# Patient Record
Sex: Male | Born: 1985 | Race: White | Hispanic: No | Marital: Married | Smoking: Never smoker
Health system: Southern US, Community
[De-identification: ages and names within clinical notes are randomized; demographics above are authoritative.]

## PROBLEM LIST (undated history)

## (undated) DIAGNOSIS — F419 Anxiety disorder, unspecified: Secondary | ICD-10-CM

## (undated) DIAGNOSIS — F32A Depression, unspecified: Secondary | ICD-10-CM

## (undated) HISTORY — DX: Anxiety disorder, unspecified: F41.9

## (undated) HISTORY — DX: Depression, unspecified: F32.A

---

## 2009-01-11 ENCOUNTER — Ambulatory Visit: Payer: Self-pay | Admitting: Family Medicine

## 2009-02-11 HISTORY — PX: APPENDECTOMY: SHX54

## 2009-12-30 ENCOUNTER — Inpatient Hospital Stay: Payer: Self-pay | Admitting: Surgery

## 2010-01-02 LAB — PATHOLOGY REPORT

## 2010-01-15 ENCOUNTER — Inpatient Hospital Stay: Payer: Self-pay | Admitting: Surgery

## 2012-10-28 IMAGING — CT CT ABD-PELV W/ CM
1 of 2 series · 15 of 32 positions shown, 19 images · non-contrast
Comparison: none

REASON FOR EXAM: (1) abd pain post ruptured appendectomy fever; (2) abd
pain post ruptured append
COMMENTS:   May transport without cardiac monitor

[Series 2: 3mm soft tissue · axial · 0.75mm/px · z∈[-1089,-561]mm · 15 of 193 slices shown, 19 images]
[im 9/193  soft-tissue]
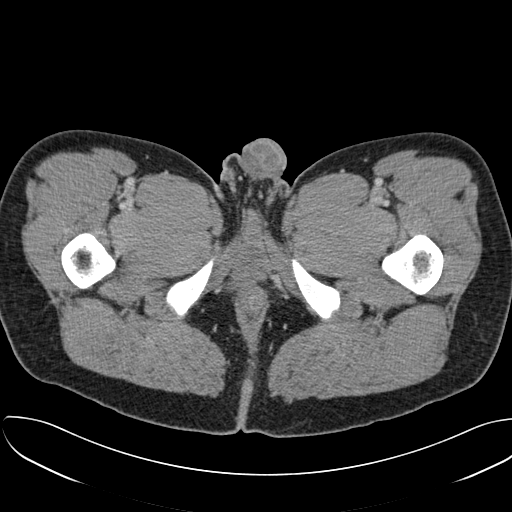
[im 9/193  bone]
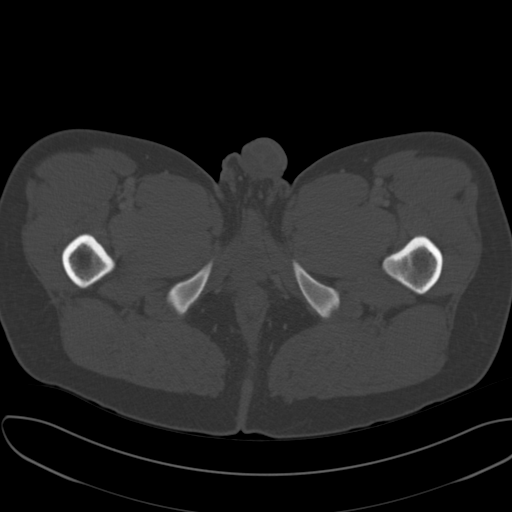
[im 25/193  soft-tissue]
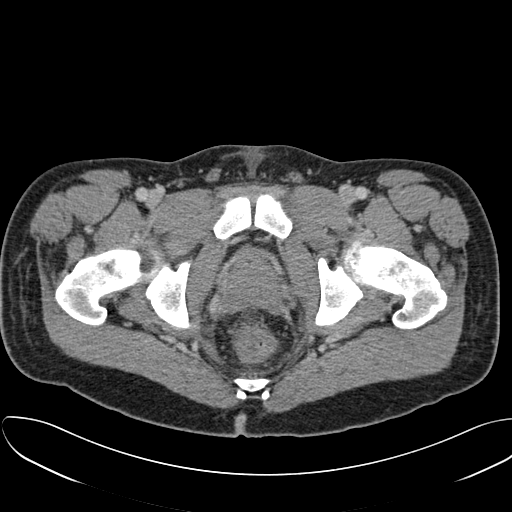
[im 41/193  soft-tissue]
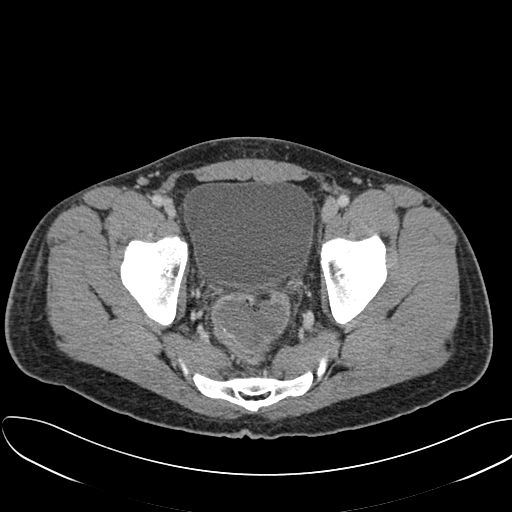
[im 57/193  soft-tissue]
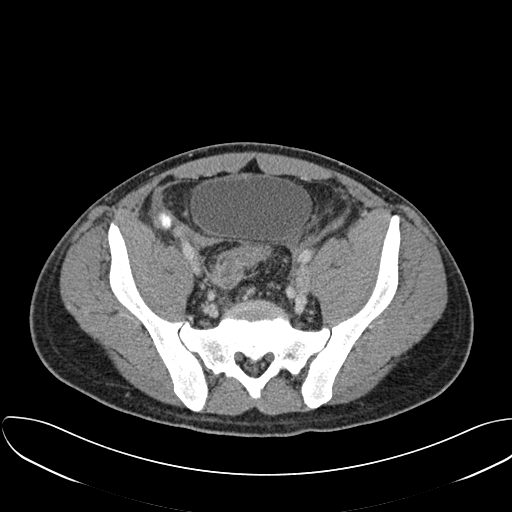
[im 65/193  soft-tissue]
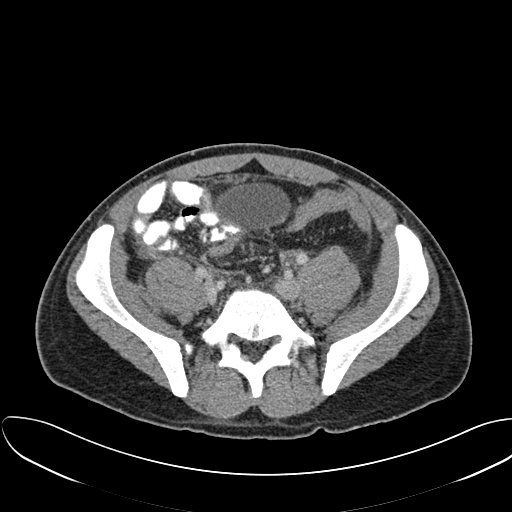
[im 81/193  soft-tissue]
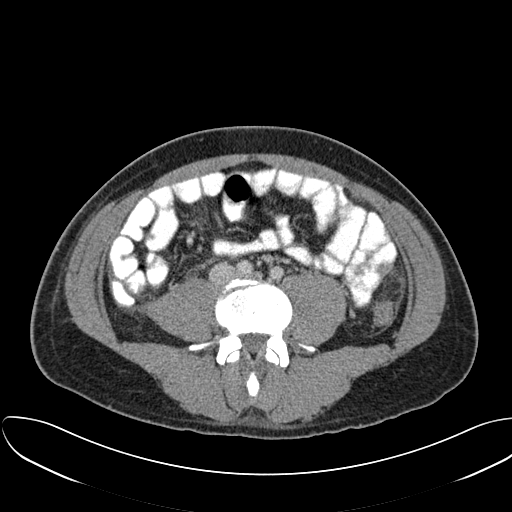
[im 97/193  soft-tissue]
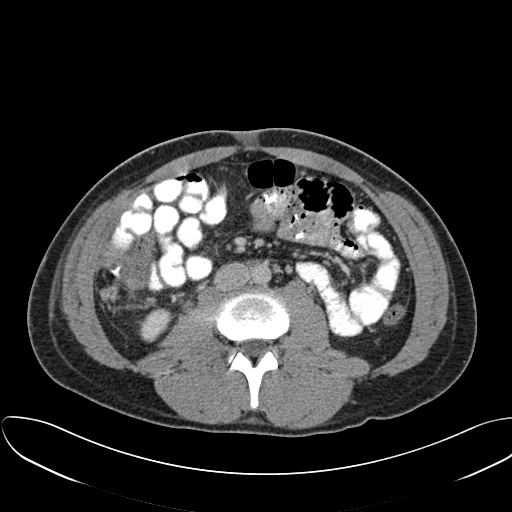
[im 113/193  soft-tissue]
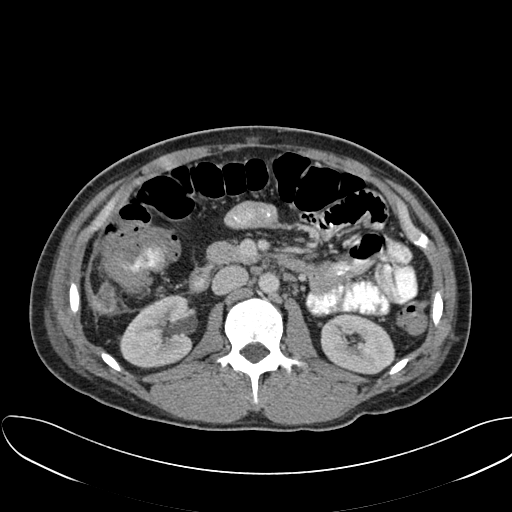
[im 129/193  soft-tissue]
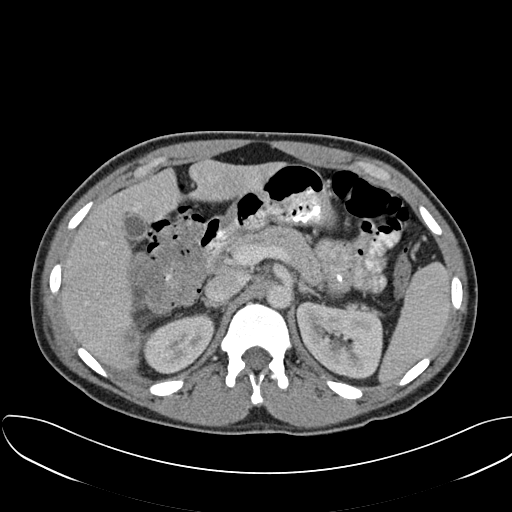
[im 129/193  bone]
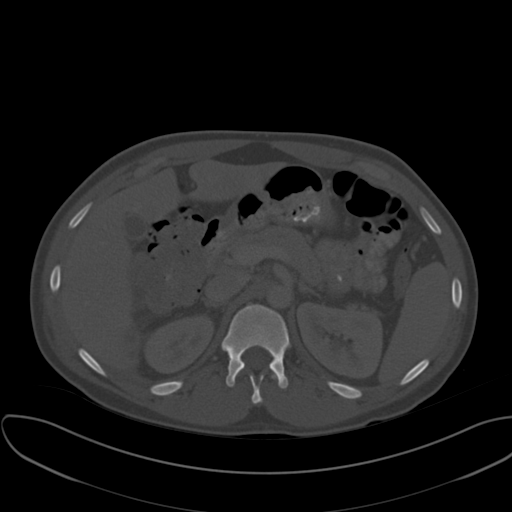
[im 137/193  soft-tissue]
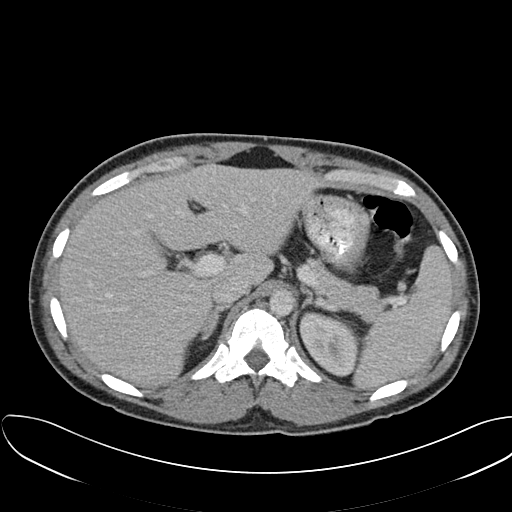
[im 153/193  soft-tissue]
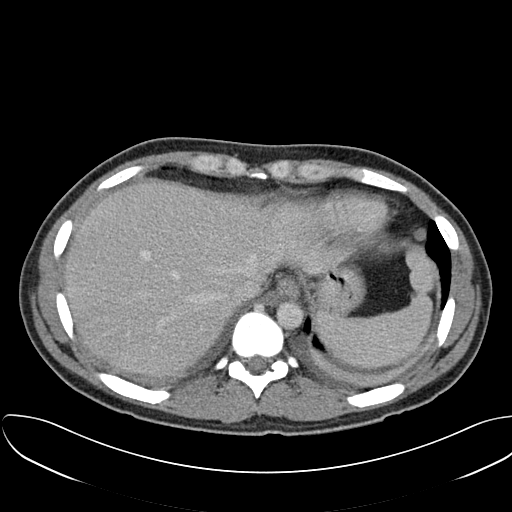
[im 161/193  lung]
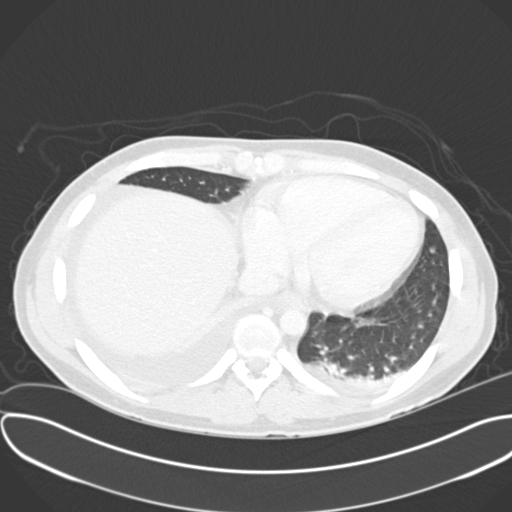
[im 169/193  soft-tissue]
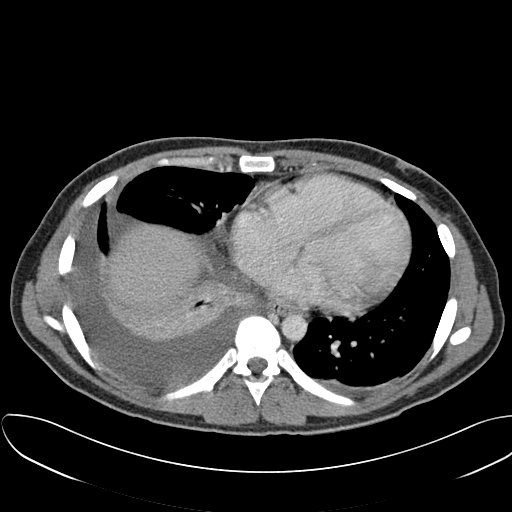
[im 169/193  lung]
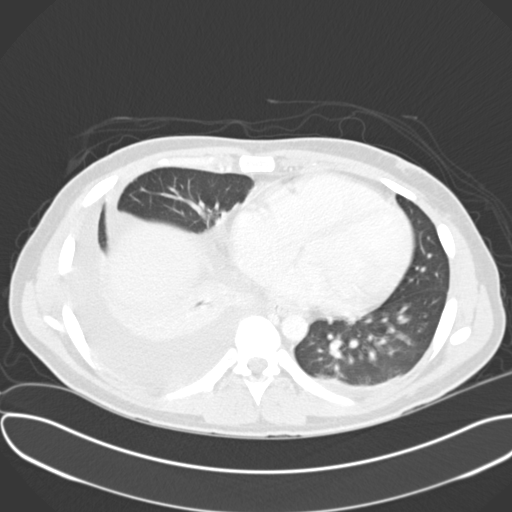
[im 177/193  lung]
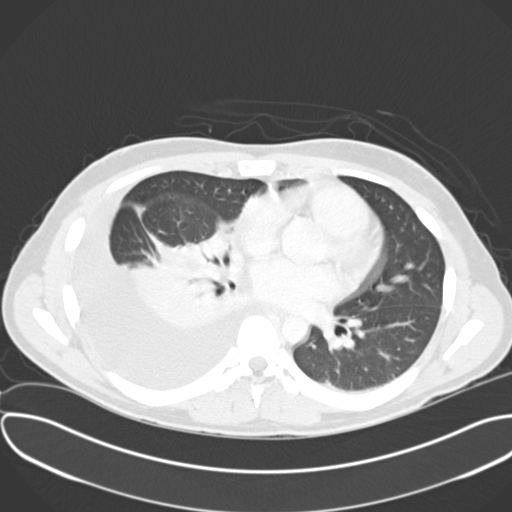
[im 185/193  soft-tissue]
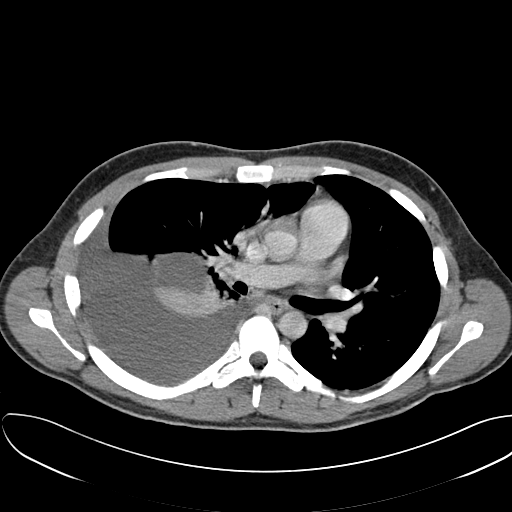
[im 185/193  lung]
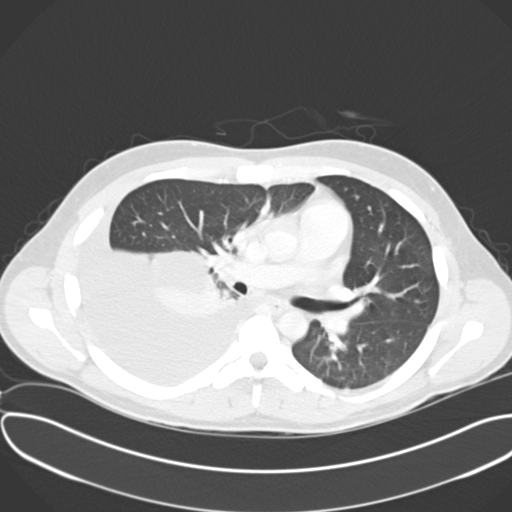

[15 of 32 positions shown; findings below may reference images not displayed]

PROCEDURE:     CT  - CT ABDOMEN / PELVIS  W  - January 15, 2010  [DATE]

RESULT:     CT of the abdomen and pelvis is performed utilizing 100 mL of
Dsovue-QLB iodinated intravenous contrast and oral contrast. There is no
previous study for comparison.

There are fluid collections in the subhepatic space posteriorly and in the
prerectal space and posterior inferior pelvic region. There appears to be
some mild thickening of the wall of cecum and ascending colon which could
represent inflammatory change either primary or secondary. A small hiatal
hernia is present. A moderately large right pleural effusion is present with
a trace to small left pleural effusion. No free air is identified. The
gallbladder is relatively decompressed. The liver, spleen, kidneys, aorta,
pancreas and remaining bowel structures appear to be within normal limits.
Parts of bowel are unopacified. Surgical clips are seen in the retrocecal
region consistent with appendectomy. There is some inflammatory stranding in
this region. The urinary bladder is moderately distended. The prostate
appears to be within normal limits.
IMPRESSION: 1. Multiple abdominal and pelvic abscess these.
2. Moderately large to large right pleural effusion with compressive
atelectasis versus infiltrate predominately in the right lower lobe and
right middle lobe.
3. Appendectomy changes.
4. Mild thickening of the wall of the ascending colon and cecum which could
be secondary. Colitis is felt to be less likely.

## 2012-10-28 IMAGING — US US GUIDE NEEDLE - US [PERSON_NAME]
1 series · 4 of 4 positions shown · non-contrast
Comparison: none

REASON FOR EXAM: large pleural effusion, please send fluid for [place] and
routine lab analysis.
COMMENTS:

[Series 1: us guide needle - us (person_name) · 4 of 4 slices shown]
[im 1/4]
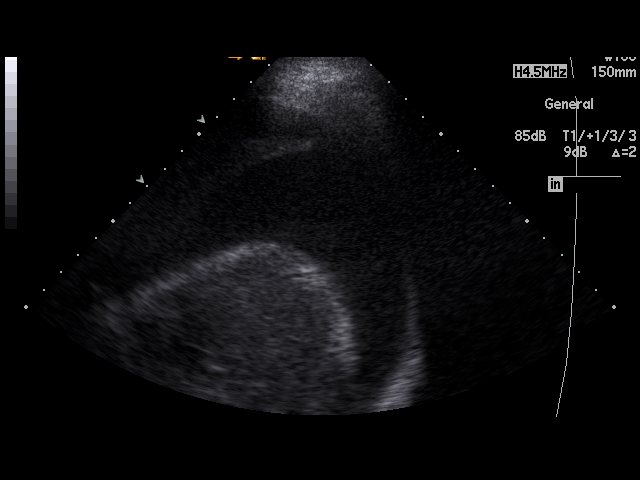
[im 2/4]
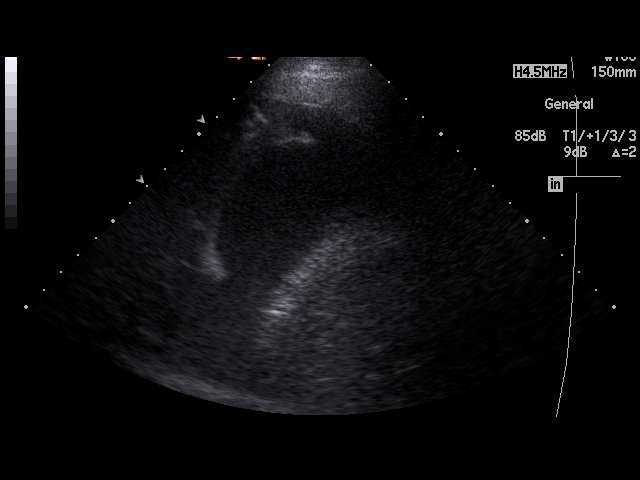
[im 3/4]
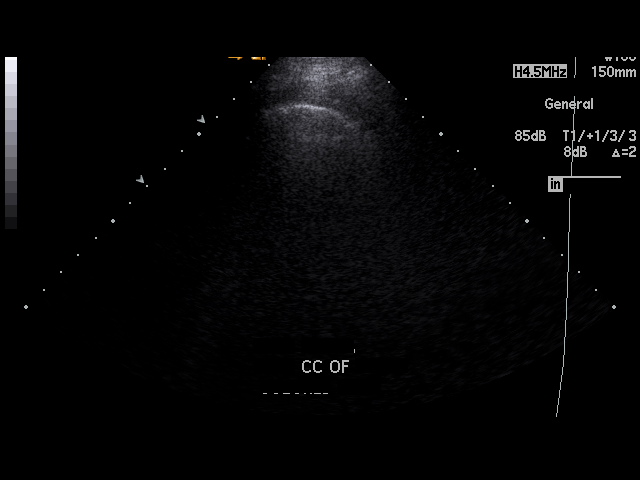
[im 4/4]
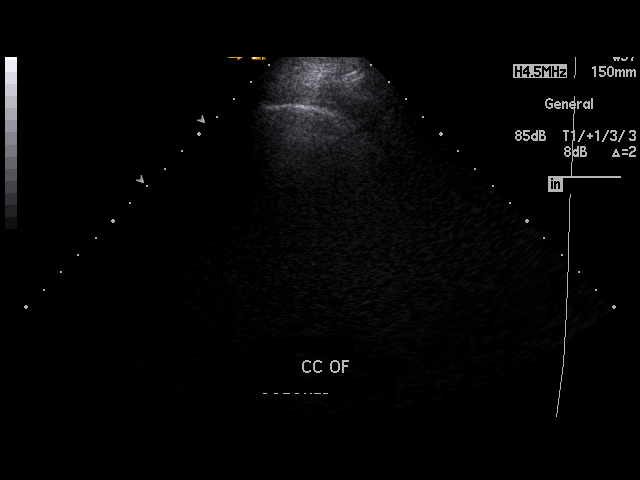

[4 of 4 positions shown; findings below may reference images not displayed]

PROCEDURE:     US  - US GUIDED THORACENTESIS RIGHT  - January 15, 2010  [DATE]

RESULT:     Comparison:  None

Procedure:
Clinical assessment was performed and informed consent obtained after all
questions were answered. The patient was was positioned upright and slightly
hunched forward.  Focused right thoracic ultrasound demonstrated a moderate
amount of pleural fluid. The mid thoracic wall was marked for thoracentesis.

A timeout was performed.  The right thorax was prepped and draped in the
usual sterile manner.  The overlying skin was anesthetized with 2 ml of 1%
lidocaine. The pleural space was accessed using a 6 French Safe-T centesis
needle catheter system and connected to a Vacutainer.

9666 ml of straw-colored pleural fluid was removed.  The drainage catheter
was removed and a sterile vaseline gauze bandage was placed at the puncture
site. The procedure was well tolerated and without complication. Hemostasis
was achieved.
IMPRESSION: Successful right ultrasound guided thoracentesis.

## 2012-10-29 IMAGING — CR DG CHEST 1V PORT
1 series · 1 of 1 positions shown · non-contrast
Comparison: none

REASON FOR EXAM: effusion
COMMENTS:

PROCEDURE:     DXR - DXR PORTABLE CHEST SINGLE VIEW  - January 16, 2010  [DATE]
RESULT:     The previously observed right pleural effusion is less prominent
compared to the pre-thoracentesis examination of 04/15/2009. No pneumothorax
is seen. The left lung field is clear.

[view not recorded]
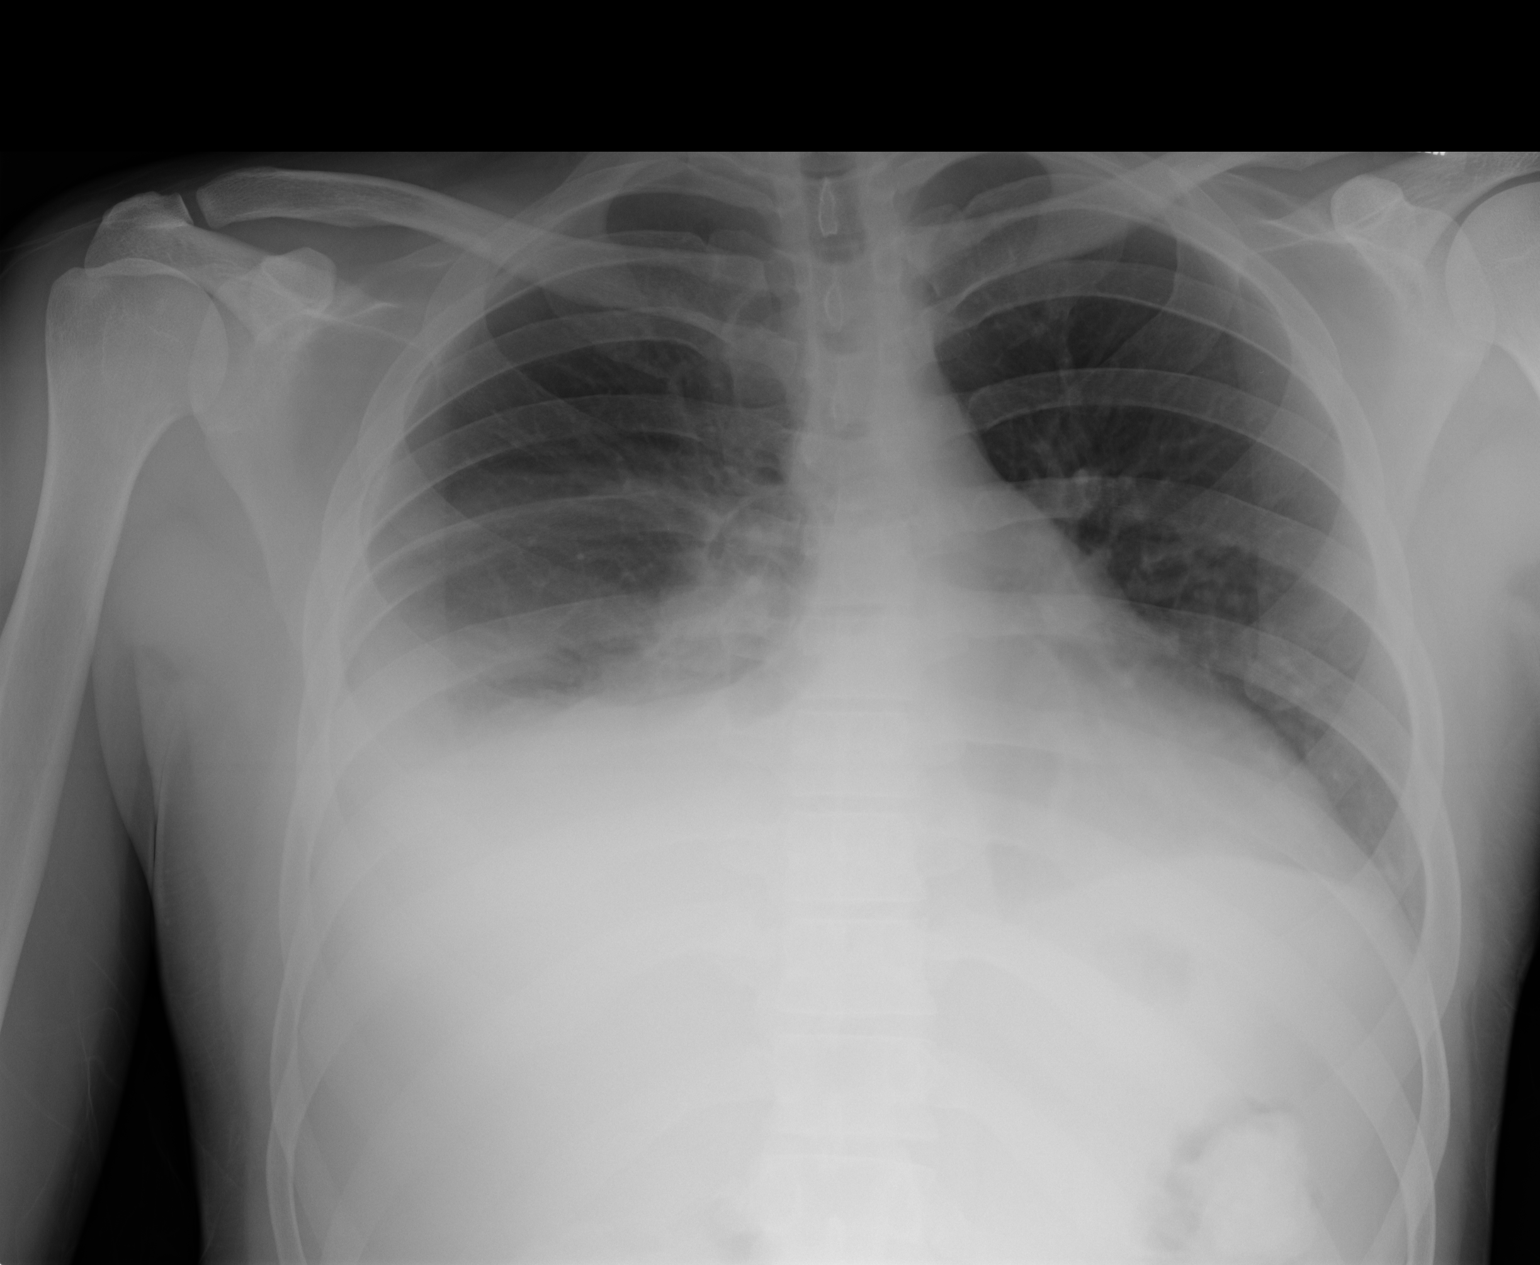

[1 of 1 positions shown; findings below may reference images not displayed]

IMPRESSION: 1.     Please see above.

## 2017-02-11 HISTORY — PX: VASECTOMY: SHX75

## 2017-06-02 ENCOUNTER — Other Ambulatory Visit: Payer: Self-pay

## 2017-06-02 ENCOUNTER — Ambulatory Visit
Admission: EM | Admit: 2017-06-02 | Discharge: 2017-06-02 | Disposition: A | Discharge status: Home / Self Care | Payer: Managed Care, Other (non HMO) | Attending: Family Medicine | Admitting: Family Medicine

## 2017-06-02 DIAGNOSIS — H6501 Acute serous otitis media, right ear: Secondary | ICD-10-CM | POA: Diagnosis not present

## 2017-06-02 DIAGNOSIS — J209 Acute bronchitis, unspecified: Secondary | ICD-10-CM

## 2017-06-02 DIAGNOSIS — J0100 Acute maxillary sinusitis, unspecified: Secondary | ICD-10-CM

## 2017-06-02 MED ORDER — ALBUTEROL SULFATE HFA 108 (90 BASE) MCG/ACT IN AERS: 1.0000 | INHALATION_SPRAY | Freq: Four times a day (QID) | RESPIRATORY_TRACT | 0 refills | Status: DC | PRN

## 2017-06-02 MED ORDER — AZITHROMYCIN 250 MG PO TABS: ORAL_TABLET | ORAL | 0 refills | Status: DC

## 2017-06-02 NOTE — ED Provider Notes (Signed)
[facility]    CSN: [id number] Arrival date & time: 06/02/17  1443     History   Chief Complaint Chief Complaint  Patient presents with  . Sinusitis    HPI Dragon Thrush is a 32 y.o. male.   The history is provided by the patient.  Sinusitis  Associated symptoms: congestion, cough, fatigue, fever, headaches and wheezing   URI  Presenting symptoms: congestion, cough, facial pain, fatigue and fever   Severity:  Moderate Onset quality:  Sudden Duration:  1 week Timing:  Constant Progression:  Worsening Chronicity:  New Relieved by:  Nothing Ineffective treatments:  OTC medications Associated symptoms: headaches, sinus pain and wheezing   Risk factors: sick contacts   Risk factors: not elderly, no chronic cardiac disease, no chronic kidney disease, no chronic respiratory disease, no diabetes mellitus, no immunosuppression, no recent illness and no recent travel     History reviewed. No pertinent past medical history.  There are no active problems to display for this patient.   History reviewed. No pertinent surgical history.     Home Medications    Prior to Admission medications   Medication Sig Start Date End Date Taking? Authorizing Provider  albuterol (PROVENTIL HFA;VENTOLIN HFA) 108 (90 Base) MCG/ACT inhaler Inhale 1-2 puffs into the lungs every 6 (six) hours as needed for wheezing or shortness of breath. 06/02/17   Norval Gable, MD  azithromycin (ZITHROMAX Z-PAK) 250 MG tablet 2 tabs po once day 1, then 1 tab po qd for next 4 days 06/02/17   Norval Gable, MD    Family History History reviewed. No pertinent family history.  Social History Social History   Tobacco Use  . Smoking status: Never Smoker  . Smokeless tobacco: Never Used  Substance Use Topics  . Alcohol use: Yes    Comment: weekends  . Drug use: Not Currently     Allergies   Penicillins   Review of Systems Review of Systems  Constitutional: Positive for fatigue and  fever.  HENT: Positive for congestion and sinus pain.   Respiratory: Positive for cough and wheezing.   Neurological: Positive for headaches.     Physical Exam Triage Vital Signs ED Triage Vitals  Enc Vitals Group     BP 06/02/17 1455 (!) 138/96     Pulse Rate 06/02/17 1455 75     Resp 06/02/17 1455 18     Temp 06/02/17 1455 98.2 F (36.8 C)     Temp Source 06/02/17 1455 Oral     SpO2 06/02/17 1455 98 %     Weight 06/02/17 1456 175 lb (79.4 kg)     Height 06/02/17 1456 5\' 10"  (1.778 m)     Head Circumference --      Peak Flow --      Pain Score 06/02/17 1456 3     Pain Loc --      Pain Edu? --      Excl. in [place]? --    No data found.  Updated Vital Signs BP (!) 138/96 (BP Location: Right Arm)   Pulse 75   Temp 98.2 F (36.8 C) (Oral)   Resp 18   Ht 5\' 10"  (1.778 m)   Wt 175 lb (79.4 kg)   SpO2 98%   BMI 25.11 kg/m   Visual Acuity Right Eye Distance:   Left Eye Distance:   Bilateral Distance:    Right Eye Near:   Left Eye Near:    Bilateral Near:  Physical Exam  Constitutional: He appears well-developed and well-nourished. No distress.  HENT:  Head: Normocephalic and atraumatic.  Right Ear: Tympanic membrane, external ear and ear canal normal.  Left Ear: Tympanic membrane, external ear and ear canal normal.  Nose: Mucosal edema present. Right sinus exhibits maxillary sinus tenderness and frontal sinus tenderness. Left sinus exhibits maxillary sinus tenderness and frontal sinus tenderness.  Mouth/Throat: Uvula is midline, oropharynx is clear and moist and mucous membranes are normal. No oropharyngeal exudate or tonsillar abscesses.  Neck: Normal range of motion. Neck supple. No tracheal deviation present. No thyromegaly present.  Cardiovascular: Normal rate, regular rhythm and normal heart sounds.  Pulmonary/Chest: Effort normal and breath sounds normal. No stridor. No respiratory distress. He has no wheezes. He has no rales. He exhibits no tenderness.    Lymphadenopathy:    He has no cervical adenopathy.  Neurological: He is alert.  Skin: Skin is warm and dry. No rash noted. He is not diaphoretic.  Nursing note and vitals reviewed.    UC Treatments / Results  Labs (all labs ordered are listed, but only abnormal results are displayed) Labs Reviewed - No data to display  EKG None Radiology No results found.  Procedures Procedures (including critical care time)  Medications Ordered in UC Medications - No data to display   Initial Impression / Assessment and Plan / UC Course  I have reviewed the triage vital signs and the nursing notes.  Pertinent labs & imaging results that were available during my care of the patient were reviewed by me and considered in my medical decision making (see chart for details).       Final Clinical Impressions(s) / UC Diagnoses   Final diagnoses:  Acute maxillary sinusitis, recurrence not specified  Right acute serous otitis media, recurrence not specified  Acute bronchitis, unspecified organism    ED Discharge Orders        Ordered    azithromycin (ZITHROMAX Z-PAK) 250 MG tablet     06/02/17 1527    albuterol (PROVENTIL HFA;VENTOLIN HFA) 108 (90 Base) MCG/ACT inhaler  Every 6 hours PRN     06/02/17 1527     1. diagnosis reviewed with patient 2. rx as per orders above; reviewed possible side effects, interactions, risks and benefits  3. Recommend supportive treatment with rest, fluids  4. Follow-up prn if symptoms worsen or don't improve  Controlled Substance Prescriptions [place] Controlled Substance Registry consulted? Not Applicable   Norval Gable, MD 06/02/17 [phone]

## 2017-06-02 NOTE — ED Triage Notes (Signed)
Pt with one week of sx that started out like a cold but now has facial pain/pressure, ear pressure, headache and blowing blood from nose.

## 2017-06-05 ENCOUNTER — Ambulatory Visit: Payer: Self-pay | Admitting: Urology

## 2017-06-06 ENCOUNTER — Ambulatory Visit: Payer: Self-pay | Admitting: Urology

## 2017-06-11 ENCOUNTER — Ambulatory Visit: Payer: Self-pay | Admitting: Urology

## 2017-06-19 ENCOUNTER — Encounter: Payer: Self-pay | Admitting: Urology

## 2017-06-19 ENCOUNTER — Ambulatory Visit (INDEPENDENT_AMBULATORY_CARE_PROVIDER_SITE_OTHER): Payer: Managed Care, Other (non HMO) | Admitting: Urology

## 2017-06-19 VITALS — BP 142/79 | HR 73 | Resp 16 | Ht 70.0 in | Wt 183.3 lb

## 2017-06-19 DIAGNOSIS — Z3009 Encounter for other general counseling and advice on contraception: Secondary | ICD-10-CM | POA: Diagnosis not present

## 2017-06-21 NOTE — Progress Notes (Signed)
06/19/2017 4:36 PM   Terry Summers 02-07-1986 [id number]  Referring provider: No referring provider defined for this encounter.  Chief Complaint  Patient presents with  . VAS Consult    HPI: Terry Summers is a 32 year old male married with 2 children.  He states he and his wife have discussed permanent sterilization and he desires vasectomy.  There is no previous history of urologic problems and he specifically denies chronic scrotal pain or history of epididymitis.   PMH: History reviewed. No pertinent past medical history.  Surgical History: Past Surgical History:  Procedure Laterality Date  . APPENDECTOMY  2011    Home Medications:  Allergies as of 06/19/2017      Reactions   Penicillins Other (See Comments)   Pt states rash/unknown "I was a baby when it happened".       Medication List        Accurate as of 06/19/17 11:59 PM. Always use your most recent med list.          albuterol 108 (90 Base) MCG/ACT inhaler Commonly known as:  PROVENTIL HFA;VENTOLIN HFA Inhale 1-2 puffs into the lungs every 6 (six) hours as needed for wheezing or shortness of breath.   azithromycin 250 MG tablet Commonly known as:  ZITHROMAX Z-PAK 2 tabs po once day 1, then 1 tab po qd for next 4 days       Allergies:  Allergies  Allergen Reactions  . Penicillins Other (See Comments)    Pt states rash/unknown "I was a baby when it happened".     Family History: Family History  Problem Relation Age of Onset  . Prostate cancer Neg Hx   . Kidney cancer Neg Hx   . Bladder Cancer Neg Hx     Social History:  reports that he has never smoked. He has never used smokeless tobacco. He reports that he drinks alcohol. He reports that he has current or past drug history.  ROS: UROLOGY Frequent Urination?: No Hard to postpone urination?: No Burning/pain with urination?: No Get up at night to urinate?: No Leakage of urine?: No Urine stream starts and stops?: No Trouble starting stream?:  No Do you have to strain to urinate?: No Blood in urine?: No Urinary tract infection?: No Sexually transmitted disease?: No Injury to kidneys or bladder?: No Painful intercourse?: No Weak stream?: No Erection problems?: No Penile pain?: No  Gastrointestinal Nausea?: No Vomiting?: No Indigestion/heartburn?: No Diarrhea?: No Constipation?: No  Constitutional Fever: No Night sweats?: No Weight loss?: No Fatigue?: No  Skin Skin rash/lesions?: No Itching?: No  Eyes Blurred vision?: No Double vision?: No  Ears/Nose/Throat Sore throat?: No Sinus problems?: No  Hematologic/Lymphatic Swollen glands?: No Easy bruising?: No  Cardiovascular Leg swelling?: No Chest pain?: No  Respiratory Cough?: Yes Shortness of breath?: No  Endocrine Excessive thirst?: No  Musculoskeletal Back pain?: No Joint pain?: No  Neurological Headaches?: No Dizziness?: No  Psychologic Depression?: No Anxiety?: No  Physical Exam: BP (!) 142/79   Pulse 73   Resp 16   Ht 5\' 10"  (1.778 m)   Wt 183 lb 4.8 oz (83.1 kg)   SpO2 98%   BMI 26.30 kg/m   Constitutional:  Alert and oriented, No acute distress. HEENT: [place] AT, moist mucus membranes.  Trachea midline, no masses. Cardiovascular: No clubbing, cyanosis, or edema. Respiratory: Normal respiratory effort, no increased work of breathing. GI: Abdomen is soft, nontender, nondistended, no abdominal masses GU: No CVA tenderness.  Penis without lesions, testes descended bilaterally  without masses or tenderness.  No paratesticular abnormalities noted and vasa are easily palpable bilaterally. Lymph: No cervical or inguinal lymphadenopathy. Skin: No rashes, bruises or suspicious lesions. Neurologic: Grossly intact, no focal deficits, moving all 4 extremities. Psychiatric: Normal mood and affect.   Assessment & Plan:   32 year old male with undesired fertility who desires vasectomy.  We had a long discussion about vasectomy. We  specifically discussed the procedure, recovery and the risks, benefits and alternatives of vasectomy. I explained that the procedure entails removal of a segment of each vas deferens, each of which conducts sperm, and that the purpose of this procedure is to cause sterility (inability to produce children or cause pregnancy). Vasectomy is intended to be permanent and irreversible form of contraception. Options for fertility after vasectomy include vasectomy reversal, or sperm retrieval with in vitro fertilization. These options are not always successful, and they may be expensive. We discussed reversible forms of birth control such as condoms, IUD or diaphragms, as well as the option of freezing sperm in a sperm bank prior to the vasectomy procedure. We discussed the importance of avoiding strenuous exercise for four days after vasectomy, and the importance of refraining from any form of ejaculation for seven days after vasectomy. I explained that vasectomy does not produce immediate sterility so another form of contraceptive must be used until sterility is assured by having semen checked for sperm. Thus, a post vasectomy semen analysis is necessary to confirm sterility. Rarely, vasectomy must be repeated. We discussed the approximately 1 in 2,000 risk of pregnancy after vasectomy for men who have post-vasectomy semen analysis showing absent sperm or rare non-motile sperm. Typical side effects include a small amount of oozing blood, some discomfort and mild swelling in the area of incision.  Vasectomy does not affect sexual performance, function, please, sensation, interest, desire, satisfaction, penile erection, volume of semen or ejaculation. Other rare risks include allergy or adverse reaction to an anesthetic, testicular atrophy, hematoma, infection/abscess, prolonged tenderness of the vas deferens, pain, swelling, painful nodule or scar (called sperm granuloma) or epididymtis. We discussed chronic testicular  pain syndrome. This has been reported to occur in as many as 1-2% of men and may be permanent. This can be treated with medication, small procedures or (rarely) surgery.  Rx Valium was sent to his pharmacy for preprocedure anxiolytic.  He was informed he would need a driver.    Abbie Sons, [place] [address] [place], [place] [address] [phone]

## 2017-06-23 ENCOUNTER — Encounter: Payer: Self-pay | Admitting: Urology

## 2017-06-23 MED ORDER — DIAZEPAM 10 MG PO TABS: ORAL_TABLET | ORAL | 0 refills | Status: DC

## 2017-07-25 ENCOUNTER — Encounter: Payer: Self-pay | Admitting: Urology

## 2017-07-25 ENCOUNTER — Ambulatory Visit (INDEPENDENT_AMBULATORY_CARE_PROVIDER_SITE_OTHER): Payer: Managed Care, Other (non HMO) | Admitting: Urology

## 2017-07-25 VITALS — BP 125/80 | HR 87 | Ht 70.0 in | Wt 183.0 lb

## 2017-07-25 DIAGNOSIS — Z302 Encounter for sterilization: Secondary | ICD-10-CM

## 2017-07-25 MED ORDER — HYDROCODONE-ACETAMINOPHEN 5-325 MG PO TABS: 1.0000 | ORAL_TABLET | ORAL | 0 refills | Status: DC | PRN

## 2017-07-25 NOTE — Progress Notes (Signed)
Vasectomy Procedure Note  Indications: The patient is a 32 y.o. male who presents today for elective sterilization.  He has been consented for the procedure.  He is aware of the risks and benefits.  He had no additional questions.  He agrees to proceed.  He denies any other significant change since his last visit.  Pre-operative Diagnosis: Elective sterilization  Post-operative Diagnosis: Elective sterilization  Premedication: Valium 10 mg po  Surgeon: Nicki Reaper C. Margherita Collyer, M.D  Description: The patient was prepped and draped in the standard fashion.  The right vas deferens was identified and brought superiorly to the anterior scrotal skin.  The skin and vas was then anesthetized utilizing 7 ml 1% lidocaine.  A small stab incision was made and spread with the vas dissector.  The vas was grasped utilizing the vas clamp and elevated out of the incision.  The vas was dissected free from surrounding tissue and vessels and an ~1 cm segment was excised.  The vas lumens were cauterized utilizing electrocautery.  The distal segment was buried in the surrounding sheath with a 3-0 chromic suture.  No significant bleeding was observed.  The vas ends were then dropped back into the hemiscrotum.  The skin was closed with hemostatic pressure.  An identical procedure was performed on the contralateral side.  Clean dry gauze was applied to the incision sites.  The patient tolerated the procedure well.  Complications:None  Recommendations: 1.  No lifting greater than 10 pounds or strenuousactivity for 1 week. 2.  Scrotal support for 1 week. 3.  Shower only for 1 week; may shower in the morning 4.  May resume intercourse in one week if no significant discomfort.  Continue alternate contraception for 12 weeks.  5.  Call for significant pain, swelling, redness, drainage or fever greater than 100.5. 6.  Rx hydrocodone/APAP 5/325 1-2 every 6 hours as needed for pain. 7.  Follow-up semen analyses 6 and 12 weeks.

## 2017-07-27 ENCOUNTER — Encounter: Payer: Self-pay | Admitting: Urology

## 2017-10-15 ENCOUNTER — Other Ambulatory Visit: Payer: Self-pay

## 2017-10-15 DIAGNOSIS — Z9852 Vasectomy status: Secondary | ICD-10-CM

## 2017-10-17 ENCOUNTER — Other Ambulatory Visit: Payer: Managed Care, Other (non HMO)

## 2017-10-17 DIAGNOSIS — Z9852 Vasectomy status: Secondary | ICD-10-CM

## 2017-10-18 LAB — POST-VAS SPERM EVALUATION,QUAL: Volume: 1.7 mL

## 2017-10-20 ENCOUNTER — Telehealth: Payer: Self-pay | Admitting: Family Medicine

## 2017-10-20 NOTE — Telephone Encounter (Signed)
Unable to leave message, VM not working.

## 2017-10-20 NOTE — Telephone Encounter (Signed)
-----   Message from Abbie Sons, MD sent at 10/19/2017 11:01 AM EDT ----- Semen analysis showed no sperm present.  Okay to use vasectomy as primary contraception.

## 2017-10-21 NOTE — Telephone Encounter (Signed)
Left detailed message.   

## 2018-02-12 ENCOUNTER — Ambulatory Visit
Admission: EM | Admit: 2018-02-12 | Discharge: 2018-02-12 | Disposition: A | Discharge status: Home / Self Care | Payer: Managed Care, Other (non HMO) | Attending: Family Medicine | Admitting: Family Medicine

## 2018-02-12 ENCOUNTER — Other Ambulatory Visit: Payer: Self-pay

## 2018-02-12 ENCOUNTER — Encounter: Payer: Self-pay | Admitting: Gynecology

## 2018-02-12 DIAGNOSIS — H1031 Unspecified acute conjunctivitis, right eye: Secondary | ICD-10-CM | POA: Insufficient documentation

## 2018-02-12 MED ORDER — MOXIFLOXACIN HCL 0.5 % OP SOLN: 1.0000 [drp] | Freq: Three times a day (TID) | OPHTHALMIC | 0 refills | Status: DC

## 2018-02-12 NOTE — ED Triage Notes (Signed)
Per patient daughter with pink eye Patient stated x 2 days itching / redness and drainage of his right eye

## 2018-02-12 NOTE — ED Provider Notes (Signed)
[facility]    CSN: [id number] Arrival date & time: 02/12/18  1601     History   Chief Complaint Chief Complaint  Patient presents with  . Conjunctivitis    HPI Terry Summers is a 33 y.o. male.   HPI  33 year old male resents with a conjunctivitis on the right eye.  He has had 2 days of itching redness and drainage.  His eye was matted this morning.  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  Does not have photosensitivity.  He is not squinting.  Does feel the grittiness in his eye.  Daughter had pinkeye and is currently taking drops herself.         History reviewed. No pertinent past medical history.  There are no active problems to display for this patient.   Past Surgical History:  Procedure Laterality Date  . APPENDECTOMY  2011       Home Medications    Prior to Admission medications   Medication Sig Start Date End Date Taking? Authorizing Provider  azithromycin (ZITHROMAX Z-PAK) 250 MG tablet 2 tabs po once day 1, then 1 tab po qd for next 4 days Patient not taking: Reported on 07/25/2017 06/02/17   Norval Gable, MD  moxifloxacin (VIGAMOX) 0.5 % ophthalmic solution Place 1 drop into the right eye 3 (three) times daily. 02/12/18   Lorin Picket, PA-C    Family History Family History  Problem Relation Age of Onset  . Prostate cancer Neg Hx   . Kidney cancer Neg Hx   . Bladder Cancer Neg Hx     Social History Social History   Tobacco Use  . Smoking status: Never Smoker  . Smokeless tobacco: Never Used  Substance Use Topics  . Alcohol use: Yes    Comment: weekends  . Drug use: Not Currently     Allergies   Penicillins   Review of Systems Review of Systems  Constitutional: Positive for activity change. Negative for appetite change, chills, fatigue and fever.  Eyes: Positive for discharge, redness and itching. Negative for photophobia, pain and visual disturbance.  All other systems reviewed and are negative.    Physical Exam Triage Vital  Signs ED Triage Vitals [02/12/18 1632]  Enc Vitals Group     BP (!) 133/91     Pulse Rate 76     Resp 16     Temp 98.2 F (36.8 C)     Temp Source Oral     SpO2 100 %     Weight 180 lb (81.6 kg)     Height 5\' 10"  (1.778 m)     Head Circumference      Peak Flow      Pain Score 2     Pain Loc      Pain Edu?      Excl. in [place]?    No data found.  Updated Vital Signs BP (!) 133/91 (BP Location: Left Arm)   Pulse 76   Temp 98.2 F (36.8 C) (Oral)   Resp 16   Ht 5\' 10"  (1.778 m)   Wt 180 lb (81.6 kg)   SpO2 100%   BMI 25.83 kg/m   Visual Acuity Right Eye Distance: 20/30 Left Eye Distance: 20/40 Bilateral Distance: 20/30(without corrective lens)  Right Eye Near:   Left Eye Near:    Bilateral Near:     Physical Exam Vitals signs and nursing note reviewed.  Constitutional:      General: He is not in acute distress.  Appearance: Normal appearance. He is not ill-appearing, toxic-appearing or diaphoretic.  HENT:     Head: Normocephalic.     Right Ear: Tympanic membrane and ear canal normal.     Left Ear: Tympanic membrane and ear canal normal.     Nose: Nose normal. No congestion or rhinorrhea.     Mouth/Throat:     Mouth: Mucous membranes are moist.  Eyes:     General:        Right eye: Discharge present.        Left eye: No discharge.     Extraocular Movements: Extraocular movements intact.     Pupils: Pupils are equal, round, and reactive to light.     Comments: Right conjunctiva is extremely erythematous.  Does have purulent in the medial canthus.  EOM are intact.  PERRLA.  Visual acuity is normal.  Neck:     Musculoskeletal: Normal range of motion and neck supple.  Musculoskeletal: Normal range of motion.  Lymphadenopathy:     Cervical: No cervical adenopathy.  Skin:    General: Skin is warm and dry.  Neurological:     General: No focal deficit present.     Mental Status: He is alert and oriented to person, place, and time.  Psychiatric:        Mood  and Affect: Mood normal.        Behavior: Behavior normal.        Thought Content: Thought content normal.        Judgment: Judgment normal.      UC Treatments / Results  Labs (all labs ordered are listed, but only abnormal results are displayed) Labs Reviewed - No data to display  EKG None  Radiology No results found.  Procedures Procedures (including critical care time)  Medications Ordered in UC Medications - No data to display  Initial Impression / Assessment and Plan / UC Course  I have reviewed the triage vital signs and the nursing notes.  Pertinent labs & imaging results that were available during my care of the patient were reviewed by me and considered in my medical decision making (see chart for details).   Patient has a bacterial conjunctivitis probably contracted through his daughter.  I will place him on Vigamox.  Cool compresses as necessary for comfort.   Final Clinical Impressions(s) / UC Diagnoses   Final diagnoses:  Acute bacterial conjunctivitis of right eye   Discharge Instructions   None    ED Prescriptions    Medication Sig Dispense Auth. Provider   moxifloxacin (VIGAMOX) 0.5 % ophthalmic solution Place 1 drop into the right eye 3 (three) times daily. 3 mL Lorin Picket, PA-C     Controlled Substance Prescriptions [place] Controlled Substance Registry consulted? Not Applicable   Lorin Picket, PA-C 02/12/18 0762

## 2018-02-13 ENCOUNTER — Telehealth: Payer: Self-pay | Admitting: Family Medicine

## 2018-02-13 MED ORDER — MOXIFLOXACIN HCL 0.5 % OP SOLN: 1.0000 [drp] | Freq: Three times a day (TID) | OPHTHALMIC | 0 refills | Status: AC

## 2018-02-13 NOTE — Telephone Encounter (Signed)
Additional medication prescribed.

## 2018-11-19 ENCOUNTER — Other Ambulatory Visit: Payer: Self-pay

## 2018-11-19 DIAGNOSIS — Z20822 Contact with and (suspected) exposure to covid-19: Secondary | ICD-10-CM

## 2018-11-20 LAB — NOVEL CORONAVIRUS, NAA: SARS-CoV-2, NAA: NOT DETECTED

## 2021-03-06 ENCOUNTER — Other Ambulatory Visit: Payer: Self-pay

## 2021-03-06 ENCOUNTER — Encounter: Payer: Self-pay | Admitting: Family Medicine

## 2021-03-06 ENCOUNTER — Ambulatory Visit (INDEPENDENT_AMBULATORY_CARE_PROVIDER_SITE_OTHER): Payer: Managed Care, Other (non HMO) | Admitting: Family Medicine

## 2021-03-06 VITALS — BP 118/82 | HR 66 | Ht 70.0 in | Wt 203.0 lb

## 2021-03-06 DIAGNOSIS — J302 Other seasonal allergic rhinitis: Secondary | ICD-10-CM | POA: Insufficient documentation

## 2021-03-06 DIAGNOSIS — D171 Benign lipomatous neoplasm of skin and subcutaneous tissue of trunk: Secondary | ICD-10-CM | POA: Diagnosis not present

## 2021-03-06 DIAGNOSIS — F320 Major depressive disorder, single episode, mild: Secondary | ICD-10-CM | POA: Insufficient documentation

## 2021-03-06 DIAGNOSIS — F329 Major depressive disorder, single episode, unspecified: Secondary | ICD-10-CM

## 2021-03-06 DIAGNOSIS — Z1159 Encounter for screening for other viral diseases: Secondary | ICD-10-CM | POA: Diagnosis not present

## 2021-03-06 DIAGNOSIS — Z114 Encounter for screening for human immunodeficiency virus [HIV]: Secondary | ICD-10-CM | POA: Diagnosis not present

## 2021-03-06 DIAGNOSIS — R7989 Other specified abnormal findings of blood chemistry: Secondary | ICD-10-CM | POA: Diagnosis not present

## 2021-03-06 DIAGNOSIS — Z0000 Encounter for general adult medical examination without abnormal findings: Secondary | ICD-10-CM | POA: Insufficient documentation

## 2021-03-06 DIAGNOSIS — Z13220 Encounter for screening for lipoid disorders: Secondary | ICD-10-CM | POA: Diagnosis not present

## 2021-03-06 MED ORDER — SERTRALINE HCL 25 MG PO TABS: 25.0000 mg | ORAL_TABLET | Freq: Every day | ORAL | 0 refills | Status: DC

## 2021-03-06 NOTE — Assessment & Plan Note (Signed)
Annual examination completed, risk stratification labs ordered, anticipatory guidance provided.  We will follow labs once resulted. 

## 2021-03-06 NOTE — Assessment & Plan Note (Signed)
This is a chronic condition ongoing for roughly a year, mild improvement, though without resolution, after 8 months.  This is currently uncontrolled.  Denies any ideations.  Has been having loss of interest in activities, prolonged sleep, increased arguments.  Patient states history raise concern for major depressive disorder, ongoing symptomatology.  Both pharmacologic and nonpharmacologic management options were reviewed and he is amenable to initiation of SSRI.  He will start sertraline 25 mg daily, additionally referral to psychiatry was placed for nonpharmacological management options and further review of possible comorbid anxiety/ADHD.  Chronic condition, uncontrolled, medication management.

## 2021-03-06 NOTE — Assessment & Plan Note (Signed)
Patient has noted this on and off for years, has a sensation that resolved and then spontaneously recurred, no significant change over the past 8 months.  He denies any pain associate with this or associated skin changes.  He is interested in definitive management.  A referral to general surgery was placed today for further evaluation management.

## 2021-03-06 NOTE — Progress Notes (Signed)
Annual Physical Exam Visit  Patient Information:  Patient ID: Saunders Arlington, male DOB: Mar 14, 1985 Age: 36 y.o. MRN: [id number]   Subjective:   CC: Annual Physical Exam  HPI:  Salil Raineri is here for their annual physical.  I reviewed the past medical history, family history, social history, surgical history, and allergies today and changes were made as necessary.  Please see the problem list section below for additional details.  Past Medical History: History reviewed. No pertinent past medical history. Past Surgical History: Past Surgical History:  Procedure Laterality Date   APPENDECTOMY  2011   LASIK Bilateral 2013   VASECTOMY  2019   WISDOM TOOTH EXTRACTION Bilateral 2006   Family History: Family History  Problem Relation Age of Onset   Stroke Paternal Grandmother    Stroke Paternal Grandfather    Prostate cancer Neg Hx    Kidney cancer Neg Hx    Bladder Cancer Neg Hx    Allergies: Allergies  Allergen Reactions   Penicillins Other (See Comments)    Pt states rash/unknown "I was a baby when it happened".    Health Maintenance: Health Maintenance  Topic Date Due   COVID-19 Vaccine (1) Never done   Hepatitis C Screening  Never done   INFLUENZA VACCINE  05/11/2021 (Originally 09/11/2020)   TETANUS/TDAP  05/04/2023   HIV Screening  Completed   HPV VACCINES  Aged Out    HM Colonoscopy     This patient has no relevant Health Maintenance data.      Medications: No current outpatient medications on file prior to visit.   No current facility-administered medications on file prior to visit.    Review of Systems: No headache, visual changes, nausea, vomiting, diarrhea, constipation, dizziness, abdominal pain, skin rash, fevers, chills, night sweats, swollen lymph nodes, weight loss, chest pain, body aches, joint swelling, muscle aches, shortness of breath, +mood changes, visual or auditory hallucinations reported.  Objective:   Vitals:   03/06/21 1354  BP:  118/82  Pulse: 66  SpO2: 97%   Vitals:   03/06/21 1354  Weight: 203 lb (92.1 kg)  Height: 5\' 10"  (1.778 m)   Body mass index is 29.13 kg/m.  General: Well Developed, well nourished, and in no acute distress.  Neuro: Alert and oriented x3, extra-ocular muscles intact, sensation grossly intact. Cranial nerves II through XII are grossly intact, motor, sensory, and coordinative functions are intact. HEENT: Normocephalic, atraumatic, pupils equal round reactive to light, neck supple, no masses, no lymphadenopathy, thyroid nonpalpable. Oropharynx, nasopharynx, external ear canals are unremarkable. Skin: Warm and dry, no rashes noted. There is a mobile, nontender roughly 3 x 3 cm somewhat firm mass at the right scapular region, findings most consistent with lipoma Cardiac: Regular rate and rhythm, no murmurs rubs or gallops. No peripheral edema. Pulses symmetric. Respiratory: Clear to auscultation bilaterally. Not using accessory muscles, speaking in full sentences.  Abdominal: Soft, nontender, nondistended, positive bowel sounds, no masses, no organomegaly. Musculoskeletal: Shoulder, elbow, wrist, hip, knee, ankle stable, and with full range of motion.  Impression and Recommendations:   The patient was counselled, risk factors were discussed, and anticipatory guidance given.  Annual physical exam Annual examination completed, risk stratification labs ordered, anticipatory guidance provided.  We will follow labs once resulted.  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  He denies any pain associate with this  or associated skin changes.  He is interested in definitive management.  A referral to general surgery was placed today for further evaluation management.  Current episode of major depressive disorder without prior episode This is a chronic condition ongoing for  roughly a year, mild improvement, though without resolution, after 8 months.  This is currently uncontrolled.  Denies any ideations.  Has been having loss of interest in activities, prolonged sleep, increased arguments.  Patient states history raise concern for major depressive disorder, ongoing symptomatology.  Both pharmacologic and nonpharmacologic management options were reviewed and he is amenable to initiation of SSRI.  He will start sertraline 25 mg daily, additionally referral to psychiatry was placed for nonpharmacological management options and further review of possible comorbid anxiety/ADHD.  Chronic condition, uncontrolled, medication management.  Orders & Medications Medications:  Meds ordered this encounter  Medications   sertraline (ZOLOFT) 25 MG tablet    Sig: Take 1 tablet (25 mg total) by mouth daily.    Dispense:  90 tablet    Refill:  0   Orders Placed This Encounter  Procedures   HIV antibody (with reflex)   Hepatitis C Antibody   Lipid panel   Apo A1 + B + Ratio   CBC with Differential/Platelet   Comprehensive metabolic panel   TSH   VITAMIN D 25 Hydroxy (Vit-D Deficiency, Fractures)   Ambulatory referral to Psychiatry   Ambulatory referral to General Surgery     Return in about 6 weeks (around 04/17/2021).    Montel Culver, MD   Primary Care Sports Medicine [place]

## 2021-03-06 NOTE — Patient Instructions (Addendum)
-   Obtain fasting labs with orders provided (can have water or black coffee but otherwise no food or drink x 8 hours before labs) - Start Zoloft (sertraline) daily - Referral coordinator will contact you for follow-up with general surgery and psychiatry - Review information provided - Attend eye doctor annually, dentist every 6 months, work towards or maintain 30 minutes of moderate intensity physical activity at least 5 days per week, and consume a balanced diet - Return in 6 weeks for follow-up - Contact us for any questions between now and then

## 2021-03-19 ENCOUNTER — Ambulatory Visit: Payer: Managed Care, Other (non HMO) | Admitting: Surgery

## 2021-03-22 LAB — CBC WITH DIFFERENTIAL/PLATELET
Basophils Absolute: 0.1 10*3/uL (ref 0.0–0.2)
Basos: 1 %
EOS (ABSOLUTE): 0.1 10*3/uL (ref 0.0–0.4)
Eos: 1 %
Hematocrit: 40.9 % (ref 37.5–51.0)
Hemoglobin: 14.1 g/dL (ref 13.0–17.7)
Immature Grans (Abs): 0 10*3/uL (ref 0.0–0.1)
Immature Granulocytes: 0 %
Lymphocytes Absolute: 2.3 10*3/uL (ref 0.7–3.1)
Lymphs: 36 %
MCH: 29.4 pg (ref 26.6–33.0)
MCHC: 34.5 g/dL (ref 31.5–35.7)
MCV: 85 fL (ref 79–97)
Monocytes Absolute: 0.5 10*3/uL (ref 0.1–0.9)
Monocytes: 9 %
Neutrophils Absolute: 3.4 10*3/uL (ref 1.4–7.0)
Neutrophils: 53 %
Platelets: 266 10*3/uL (ref 150–450)
RBC: 4.79 x10E6/uL (ref 4.14–5.80)
RDW: 12.3 % (ref 11.6–15.4)
WBC: 6.4 10*3/uL (ref 3.4–10.8)

## 2021-03-22 LAB — COMPREHENSIVE METABOLIC PANEL
ALT: 32 IU/L (ref 0–44)
AST: 33 IU/L (ref 0–40)
Albumin/Globulin Ratio: 2.4 — ABNORMAL HIGH (ref 1.2–2.2)
Albumin: 5 g/dL (ref 4.0–5.0)
Alkaline Phosphatase: 75 IU/L (ref 44–121)
BUN/Creatinine Ratio: 12 (ref 9–20)
BUN: 15 mg/dL (ref 6–20)
Bilirubin Total: 0.4 mg/dL (ref 0.0–1.2)
CO2: 24 mmol/L (ref 20–29)
Calcium: 9.1 mg/dL (ref 8.7–10.2)
Chloride: 99 mmol/L (ref 96–106)
Creatinine, Ser: 1.22 mg/dL (ref 0.76–1.27)
Globulin, Total: 2.1 g/dL (ref 1.5–4.5)
Glucose: 95 mg/dL (ref 70–99)
Potassium: 4.8 mmol/L (ref 3.5–5.2)
Sodium: 139 mmol/L (ref 134–144)
Total Protein: 7.1 g/dL (ref 6.0–8.5)
eGFR: 79 mL/min/{1.73_m2} (ref >59)

## 2021-03-22 LAB — TSH: TSH: 3.2 u[IU]/mL (ref 0.450–4.500)

## 2021-03-22 LAB — HEPATITIS C ANTIBODY: Hep C Virus Ab: <0.1 s/co ratio (ref 0.0–0.9)

## 2021-03-22 LAB — APO A1 + B + RATIO
Apolipo. B/A-1 Ratio: 0.6 ratio (ref 0.0–0.7)
Apolipoprotein A-1: 177 mg/dL (ref 101–178)
Apolipoprotein B: 108 mg/dL — ABNORMAL HIGH (ref <90)

## 2021-03-22 LAB — LIPID PANEL
Chol/HDL Ratio: 3.1 ratio (ref 0.0–5.0)
Cholesterol, Total: 235 mg/dL — ABNORMAL HIGH (ref 100–199)
HDL: 75 mg/dL (ref >39)
LDL Chol Calc (NIH): 138 mg/dL — ABNORMAL HIGH (ref 0–99)
Triglycerides: 129 mg/dL (ref 0–149)
VLDL Cholesterol Cal: 22 mg/dL (ref 5–40)

## 2021-03-22 LAB — HIV ANTIBODY (ROUTINE TESTING W REFLEX): HIV Screen 4th Generation wRfx: NONREACTIVE

## 2021-03-22 LAB — VITAMIN D 25 HYDROXY (VIT D DEFICIENCY, FRACTURES): Vit D, 25-Hydroxy: 25.9 ng/mL — ABNORMAL LOW (ref 30.0–100.0)

## 2021-03-23 ENCOUNTER — Other Ambulatory Visit: Payer: Self-pay | Admitting: Family Medicine

## 2021-03-23 DIAGNOSIS — R7989 Other specified abnormal findings of blood chemistry: Secondary | ICD-10-CM

## 2021-03-23 MED ORDER — VITAMIN D (ERGOCALCIFEROL) 1.25 MG (50000 UNIT) PO CAPS: 50000.0000 [IU] | ORAL_CAPSULE | ORAL | 0 refills | Status: DC

## 2021-03-28 ENCOUNTER — Ambulatory Visit: Payer: Managed Care, Other (non HMO) | Admitting: Surgery

## 2021-04-17 ENCOUNTER — Other Ambulatory Visit: Payer: Self-pay

## 2021-04-17 ENCOUNTER — Encounter: Payer: Self-pay | Admitting: Family Medicine

## 2021-04-17 ENCOUNTER — Ambulatory Visit (INDEPENDENT_AMBULATORY_CARE_PROVIDER_SITE_OTHER): Payer: Managed Care, Other (non HMO) | Admitting: Family Medicine

## 2021-04-17 VITALS — BP 118/74 | HR 67 | Ht 70.0 in | Wt 200.0 lb

## 2021-04-17 DIAGNOSIS — F329 Major depressive disorder, single episode, unspecified: Secondary | ICD-10-CM

## 2021-04-17 MED ORDER — SERTRALINE HCL 50 MG PO TABS: 50.0000 mg | ORAL_TABLET | Freq: Every day | ORAL | 2 refills | Status: DC

## 2021-04-17 NOTE — Progress Notes (Signed)
?  ? ?  Primary Care / Sports Medicine Office Visit ? ?Patient Information:  ?Patient ID: Terry Summers, male DOB: [birth year] Age: 36 y.o. MRN: [id number]  ? ?[name] is a pleasant 36 y.o. male presenting with the following: ? ?Chief Complaint  ?Patient presents with  ? Depression  ?  Psychiatry appointment 05/08/21; taking Zoloft 25 mg daily and tolerating well  ? Lipoma  ?  General surgery appointment 06/20/21  ? ? ?Vitals:  ? 04/17/21 1536  ?BP: 118/74  ?Pulse: 67  ?SpO2: 98%  ? ?Vitals:  ? 04/17/21 1536  ?Weight: 200 lb (90.7 kg)  ?Height: '5\' 10"'$  (1.778 m)  ? ?Body mass index is 28.7 kg/m?. ? ?No results found.  ? ?Independent interpretation of notes and tests performed by another provider:  ? ?None ? ?Procedures performed:  ? ?None ? ?Pertinent History, Exam, Impression, and Recommendations:  ? ?Current episode of major depressive disorder without prior episode ?Patient's PHQ and GAD scores demonstrate modest improvement, subjectively he feels no significant change, does have upcoming visit with psychiatry 3/28.  Given his persistent stated symptoms, I have advised further titration of sertraline to 50 mg daily, adjunct nonpharmacologic treatments such as exercise therapy reviewed and he will consider this.  We will follow peripherally on this issue after he establishes with psychiatry. ? ?Chronic condition, symptomatic, Rx management  ? ?Orders & Medications ?Meds ordered this encounter  ?Medications  ? sertraline (ZOLOFT) 50 MG tablet  ?  Sig: Take 1 tablet (50 mg total) by mouth daily.  ?  Dispense:  30 tablet  ?  Refill:  2  ? ?No orders of the defined types were placed in this encounter. ?  ? ?Return in about 1 year (around 04/18/2022).  ?  ? ?Montel Culver, MD ? ? Primary Care Sports Medicine ?[facility] ?[place]  ? ?

## 2021-04-17 NOTE — Assessment & Plan Note (Signed)
Patient's PHQ and GAD scores demonstrate modest improvement, subjectively he feels no significant change, does have upcoming visit with psychiatry 3/28.  Given his persistent stated symptoms, I have advised further titration of sertraline to 50 mg daily, adjunct nonpharmacologic treatments such as exercise therapy reviewed and he will consider this.  We will follow peripherally on this issue after he establishes with psychiatry. ? ?Chronic condition, symptomatic, Rx management ?

## 2021-05-08 ENCOUNTER — Other Ambulatory Visit: Payer: Self-pay

## 2021-05-08 ENCOUNTER — Encounter: Payer: Self-pay | Admitting: Psychiatry

## 2021-05-08 ENCOUNTER — Ambulatory Visit (INDEPENDENT_AMBULATORY_CARE_PROVIDER_SITE_OTHER): Payer: 59 | Admitting: Psychiatry

## 2021-05-08 VITALS — BP 148/98 | HR 70 | Temp 97.9°F | Wt 202.8 lb

## 2021-05-08 DIAGNOSIS — Z789 Other specified health status: Secondary | ICD-10-CM

## 2021-05-08 DIAGNOSIS — R41840 Attention and concentration deficit: Secondary | ICD-10-CM

## 2021-05-08 DIAGNOSIS — F320 Major depressive disorder, single episode, mild: Secondary | ICD-10-CM | POA: Diagnosis not present

## 2021-05-08 DIAGNOSIS — F4010 Social phobia, unspecified: Secondary | ICD-10-CM

## 2021-05-08 MED ORDER — SERTRALINE HCL 100 MG PO TABS: 100.0000 mg | ORAL_TABLET | Freq: Every day | ORAL | 1 refills | Status: DC

## 2021-05-08 MED ORDER — HYDROXYZINE PAMOATE 25 MG PO CAPS: 25.0000 mg | ORAL_CAPSULE | Freq: Every evening | ORAL | 1 refills | Status: DC | PRN

## 2021-05-08 NOTE — Progress Notes (Signed)
Psychiatric Initial Adult Assessment  ? ?Patient Identification: Pinchus Weckwerth ?MRN:  [id number] ?Date of Evaluation:  05/08/2021 ?Referral Source: Dr.Jason Zigmund Daniel ?Chief Complaint:   ?Chief Complaint  ?Patient presents with  ? Establish Care: 36 year old Caucasian male, with recent diagnosis of depression by his primary care provider few weeks ago presented to establish care.  ? ?Visit Diagnosis:  ?  ICD-10-CM   ?1. Current mild episode of major depressive disorder without prior episode (HCC)  F32.0 sertraline (ZOLOFT) 100 MG tablet  ?  hydrOXYzine (VISTARIL) 25 MG capsule  ?  ?2. Social anxiety disorder  F40.10   ?  ?3. Attention and concentration deficit  R41.840   ?  ?4. Significant use of alcohol  Z78.9   ?  ? ? ?History of Present Illness:  Raiyan Dalesandro is a 36 year old Caucasian male, employed, married, lives in [place], has a history of MDD (diagnosed few weeks ago per PMD), anxiety, attention and concentration problem, vitamin D deficiency, was evaluated in office today, presented to establish care. ? ?Patient reports 14 months ago he started noticing changes in his mood.  Patient reports there was no trigger or psychosocial stressors at that time.  Patient reports he started feeling hopeless, had sadness, anhedonia and his wife felt he needed to see someone to get care.  Patient reports it was difficult to establish care with a provider and finally he was able to establish care with Dr. Zigmund Daniel.  Patient reports he was started on medication-sertraline 25 mg 03/06/2021, dosage increased to 50 mg-04/17/2021.  Patient denies side effects to the sertraline.  He however reports he has not noticed much benefit from it yet.  He continues to struggle with low mood, feeling hopeless, sleep problems, feeling tired, feeling bad about himself and so on. ? ?Patient also reports anxiety symptoms, he is often worrying about things, often restless, fidgety and needs to stay busy all the time.  Patient calls himself socially  anxious.  He does not like to be in any kind of social situations groups, does not like to do public speaking, does not do well in meetings.  He reports he tries to stay home most of the time.  Patient reports he functions okay at work since he has his own team and there are familiar .  Unfamiliar places, unfamiliar people makes him anxious.  This has been going on since the past several years. ? ?Patient does report sleep problems, reports sleep is interrupted throughout the night.  This has been going on since the past several months.  Patient does report having to take a nap during the day for 45 minutes or so.  Patient is currently not on sleep medications. ? ?Does report a history of alcoholism, reports he drinks 8 beers, 12 ounce 3-4 times a week.  Patient reports he drinks mostly on Fridays Saturdays and Sundays.  Weekdays he tries to avoid drinking.  He has been drinking alcohol since the past several years.  Denies any alcohol-related problems, legal problems, medical problems or problems functioning at his work or relationship. ? ?Patient does report a history of attention and focus problems-he reports this has been going on since childhood.  Patient reports he struggles with procrastination, staying on task, making careless mistakes, being restless and fidgety, impulsive and so on.  Patient reports his son was recently diagnosed with ADHD and his wife felt he may have similar symptoms and wanted him to have an evaluation for ADHD. ? ?Patient denies any suicidality, homicidality or perceptual  disturbances. ? ?Patient denies any other concerns today. ? ? ?Associated Signs/Symptoms: ?Depression Symptoms:  depressed mood, ?anhedonia, ?insomnia, ?anxiety, ?loss of energy/fatigue, ?disturbed sleep, ?(Hypo) Manic Symptoms:   Denies ?Anxiety Symptoms:  Excessive Worry, ?Social Anxiety, ?Psychotic Symptoms:   Denies ?PTSD Symptoms: ?Negative ? ?Past Psychiatric History: Patient denies inpatient mental health  admissions.  Reports he briefly was under the care of a therapist in [place]-8 months ago however he did not feel it was helpful.  Most recently his primary care provider-diagnosed him with MDD.  Denies suicide attempts. ? ?Previous Psychotropic Medications: Yes sertraline. ? ?Substance Abuse History in the last 12 months:  No.  As noted above-binging on alcohol-8 beers daily 3-4 times a week.  This has been going on since the past several years. ? ?Consequences of Substance Abuse: ?Negative ? ?Past Medical History:  ?Past Medical History:  ?Diagnosis Date  ? Anxiety   ? Depression   ?  ?Past Surgical History:  ?Procedure Laterality Date  ? APPENDECTOMY  2011  ? LASIK Bilateral 2013  ? VASECTOMY  2019  ? WISDOM TOOTH EXTRACTION Bilateral 2006  ? ? ?Family Psychiatric History: As noted below-ADHD-1-year-old son. ? ?Family History:  ?Family History  ?Problem Relation Age of Onset  ? Stroke Paternal Grandfather   ? Stroke Paternal Grandmother   ? ADD / ADHD Son   ? Prostate cancer Neg Hx   ? Kidney cancer Neg Hx   ? Bladder Cancer Neg Hx   ? ? ?Social History:   ?Social History  ? ?Socioeconomic History  ? Marital status: Married  ?  Spouse name: Herchel Hopkin  ? Number of children: 2  ? Years of education: 24  ? Highest education level: Associate degree: academic program  ?Occupational History  ? Not on file  ?Tobacco Use  ? Smoking status: Never  ? Smokeless tobacco: Never  ?Vaping Use  ? Vaping Use: Never used  ?Substance and Sexual Activity  ? Alcohol use: Yes  ?  Alcohol/week: 24.0 standard drinks  ?  Types: 24 Standard drinks or equivalent per week  ? Drug use: Not Currently  ? Sexual activity: Yes  ?  Partners: Female  ?  Birth control/protection: Surgical  ?Other Topics Concern  ? Not on file  ?Social History Narrative  ? Not on file  ? ?Social Determinants of Health  ? ?Financial Resource Strain: Not on file  ?Food Insecurity: Not on file  ?Transportation Needs: Not on file  ?Physical Activity: Not on file   ?Stress: Not on file  ?Social Connections: Not on file  ? ? ?Additional Social History: Patient was born in [place].  He moved to [place] at the age of 8.  Patient was raised by both parents.  He has 1 sister.  He has a good relationship with his family.  Patient has an associate degree.  Patient has been married since the past 12 years.  Patient has an 76-year-old son and a 37-year-old daughter.  They currently live together in [place].  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  Patient denies any legal problems. ? ?Allergies:   ?Allergies  ?Allergen Reactions  ? Penicillins Other (See Comments)  ?  Pt states rash/unknown "I was a baby when it happened".   ? ? ?Metabolic Disorder Labs: ?No results found for: HGBA1C, MPG ?No results found for: PROLACTIN ?Lab Results  ?Component Value Date  ? CHOL 235 (H) 03/21/2021  ? TRIG  129 03/21/2021  ? HDL 75 03/21/2021  ? CHOLHDL 3.1 03/21/2021  ? LDLCALC 138 (H) 03/21/2021  ? ?Lab Results  ?Component Value Date  ? TSH 3.200 03/21/2021  ? ? ?Therapeutic Level Labs: ?No results found for: LITHIUM ?No results found for: CBMZ ?No results found for: VALPROATE ? ?Current Medications: ?Current Outpatient Medications  ?Medication Sig Dispense Refill  ? hydrOXYzine (VISTARIL) 25 MG capsule Take 1-2 capsules (25-50 mg total) by mouth at bedtime as needed. For sleep 60 capsule 1  ? sertraline (ZOLOFT) 100 MG tablet Take 1 tablet (100 mg total) by mouth daily with breakfast. 30 tablet 1  ? Vitamin D, Ergocalciferol, (DRISDOL) 1.25 MG (50000 UNIT) CAPS capsule Take 1 capsule (50,000 Units total) by mouth every 7 (seven) days. Take for 8 total doses(weeks) 8 capsule 0  ? ?No current facility-administered medications for this visit.  ? ? ?Musculoskeletal: ?Strength & Muscle Tone: within normal limits ?Gait & Station: normal ?Patient leans: N/A ? ?Psychiatric Specialty Exam: ?Review of Systems  ?Musculoskeletal:   ?     BL hand  pain , elbow pain - chronic  ?Psychiatric/Behavioral:  Positive for decreased concentration, dysphoric mood and sleep disturbance. The patient is nervous/anxious.   ?All other systems reviewed and are negative.  ?

## 2021-05-08 NOTE — Patient Instructions (Signed)
Hydroxyzine Capsules or Tablets ?What is this medication? ?HYDROXYZINE (hye [place] i zeen) treats the symptoms of allergies and allergic reactions. It may also be used to treat anxiety or cause drowsiness before a procedure. It works by blocking histamine, a substance released by the body during an allergic reaction. It belongs to a group of medications called antihistamines. ?This medicine may be used for other purposes; ask your health care provider or pharmacist if you have questions. ?COMMON BRAND NAME(S): ANX, Atarax, Rezine, Vistaril ?What should I tell my care team before I take this medication? ?They need to know if you have any of these conditions: ?Glaucoma ?Heart disease ?History of irregular heartbeat ?Kidney disease ?Liver disease ?Lung or breathing disease, like asthma ?Stomach or intestine problems ?Thyroid disease ?Trouble passing urine ?An unusual or allergic reaction to hydroxyzine, cetirizine, other medications, foods, dyes or preservatives ?Pregnant or trying to get pregnant ?Breast-feeding ?How should I use this medication? ?Take this medication by mouth with a full glass of water. Follow the directions on the prescription label. You may take this medication with food or on an empty stomach. Take your medication at regular intervals. Do not take your medication more often than directed. ?Talk to your care team regarding the use of this medication in children. Special care may be needed. While this medication may be prescribed for children as young as 103 years of age for selected conditions, precautions do apply. ?Patients over 58 years old may have a stronger reaction and need a smaller dose. ?Overdosage: If you think you have taken too much of this medicine contact a poison control center or emergency room at once. ?NOTE: This medicine is only for you. Do not share this medicine with others. ?What if I miss a dose? ?If you miss a dose, take it as soon as you can. If it is almost time for your next  dose, take only that dose. Do not take double or extra doses. ?What may interact with this medication? ?Do not take this medication with any of the following: ?Cisapride ?Dronedarone ?Pimozide ?Thioridazine ?This medication may also interact with the following: ?Alcohol ?Antihistamines for allergy, cough, and cold ?Atropine ?Barbiturate medications for sleep or seizures, like phenobarbital ?Certain antibiotics like erythromycin or clarithromycin ?Certain medications for anxiety or sleep ?Certain medications for bladder problems like oxybutynin, tolterodine ?Certain medications for depression or psychotic disturbances ?Certain medications for irregular heart beat ?Certain medications for Parkinson's disease like benztropine, trihexyphenidyl ?Certain medications for seizures like phenobarbital, primidone ?Certain medications for stomach problems like dicyclomine, hyoscyamine ?Certain medications for travel sickness like scopolamine ?Ipratropium ?Narcotic medications for pain ?Other medications that prolong the QT interval (which can cause an abnormal heart rhythm) like dofetilide ?This list may not describe all possible interactions. Give your health care provider a list of all the medicines, herbs, non-prescription drugs, or dietary supplements you use. Also tell them if you smoke, drink alcohol, or use illegal drugs. Some items may interact with your medicine. ?What should I watch for while using this medication? ?Tell your care team if your symptoms do not improve. ?You may get drowsy or dizzy. Do not drive, use machinery, or do anything that needs mental alertness until you know how this medication affects you. Do not stand or sit up quickly, especially if you are an older patient. This reduces the risk of dizzy or fainting spells. Alcohol may interfere with the effect of this medication. Avoid alcoholic drinks. ?Your mouth may get dry. Chewing sugarless gum or sucking hard  candy, and drinking plenty of water may  help. Contact your care team if the problem does not go away or is severe. ?This medication may cause dry eyes and blurred vision. If you wear contact lenses you may feel some discomfort. Lubricating drops may help. See your eye care specialist if the problem does not go away or is severe. ?If you are receiving skin tests for allergies, tell your care team you are using this medication. ?What side effects may I notice from receiving this medication? ?Side effects that you should report to your care team as soon as possible: ?Allergic reactions--skin rash, itching, hives, swelling of the face, lips, tongue, or throat ?Heart rhythm changes--fast or irregular heartbeat, dizziness, feeling faint or lightheaded, chest pain, trouble breathing ?Side effects that usually do not require medical attention (report to your care team if they continue or are bothersome): ?Confusion ?Drowsiness ?Dry mouth ?Hallucinations ?Headache ?This list may not describe all possible side effects. Call your doctor for medical advice about side effects. You may report side effects to FDA at 1-800-FDA-1088. ?Where should I keep my medication? ?Keep out of the reach of children and pets. ?Store at room temperature between 15 and 30 degrees C (59 and 86 degrees F). Keep container tightly closed. Throw away any unused medication after the expiration date. ?NOTE: This sheet is a summary. It may not cover all possible information. If you have questions about this medicine, talk to your doctor, pharmacist, or health care provider. ?? 2022 Elsevier/Gold Standard (2020-04-12 00:00:00) ?Insomnia ?Insomnia is a sleep disorder that makes it difficult to fall asleep or stay asleep. Insomnia can cause fatigue, low energy, difficulty concentrating, mood swings, and poor performance at work or school. ?There are three different ways to classify insomnia: ?Difficulty falling asleep. ?Difficulty staying asleep. ?Waking up too early in the morning. ?Any type of  insomnia can be long-term (chronic) or short-term (acute). Both are common. Short-term insomnia usually lasts for three months or less. Chronic insomnia occurs at least three times a week for longer than three months. ?What are the causes? ?Insomnia may be caused by another condition, situation, or substance, such as: ?Anxiety. ?Certain medicines. ?Gastroesophageal reflux disease (GERD) or other gastrointestinal conditions. ?Asthma or other breathing conditions. ?Restless legs syndrome, sleep apnea, or other sleep disorders. ?Chronic pain. ?Menopause. ?Stroke. ?Abuse of alcohol, tobacco, or illegal drugs. ?Mental health conditions, such as depression. ?Caffeine. ?Neurological disorders, such as Alzheimer's disease. ?An overactive thyroid (hyperthyroidism). ?Sometimes, the cause of insomnia may not be known. ?What increases the risk? ?Risk factors for insomnia include: ?Gender. Women are affected more often than men. ?Age. Insomnia is more common as you get older. ?Stress. ?Lack of exercise. ?Irregular work schedule or working night shifts. ?Traveling between different time zones. ?Certain medical and mental health conditions. ?What are the signs or symptoms? ?If you have insomnia, the main symptom is having trouble falling asleep or having trouble staying asleep. This may lead to other symptoms, such as: ?Feeling fatigued or having low energy. ?Feeling nervous about going to sleep. ?Not feeling rested in the morning. ?Having trouble concentrating. ?Feeling irritable, anxious, or depressed. ?How is this diagnosed? ?This condition may be diagnosed based on: ?Your symptoms and medical history. Your health care provider may ask about: ?Your sleep habits. ?Any medical conditions you have. ?Your mental health. ?A physical exam. ?How is this treated? ?Treatment for insomnia depends on the cause. Treatment may focus on treating an underlying condition that is causing insomnia. Treatment may also include: ?  Medicines to help  you sleep. ?Counseling or therapy. ?Lifestyle adjustments to help you sleep better. ?Follow these instructions at home: ?Eating and drinking ? ?Limit or avoid alcohol, caffeinated beverages, and cigar

## 2021-05-14 ENCOUNTER — Encounter: Payer: Self-pay | Admitting: Psychology

## 2021-05-16 ENCOUNTER — Telehealth: Payer: Self-pay | Admitting: Psychiatry

## 2021-05-16 ENCOUNTER — Encounter: Payer: Self-pay | Admitting: Family Medicine

## 2021-05-16 MED ORDER — ESCITALOPRAM OXALATE 10 MG PO TABS: ORAL_TABLET | ORAL | 0 refills | Status: DC

## 2021-05-16 NOTE — Telephone Encounter (Signed)
Please see message from pt. Thank you

## 2021-05-16 NOTE — Telephone Encounter (Signed)
Please review.  KP

## 2021-05-16 NOTE — Telephone Encounter (Signed)
Talked with the patient. Details to be in his note.

## 2021-05-16 NOTE — Telephone Encounter (Signed)
Received message from his PCP. Called the patient.  ?He states that he started to have difficulty in getting and maintaining erection since being on sertraline.  He thinks it has gotten worse since recent uptitration of this medication. He denies significant change in his mood. Although he can deal with nausea, he is concerned about this side effect.  Discussed treatment options.  He agrees to try lexapro to target both depression and anxiety.  Discussed potential risk of serotonin syndrome, GI side effect, and sexual side effect.  May consider Trintellix or bupropion in the future if the below does not work.  ? ?Week 1: decrease sertraline 50 mg daily, start Lexapro 5 mg daily ?Week 2: Decrease sertraline 25 mg daily, increase Lexapro 10 mg daily ?Week 3: discontinue sertraline, continue lexapro 10 mg daily  ?Week 4:- continue lexapro 10 mg daily  ? ?Will forward this note to Dr. Shea Evans so that she is aware of this change.  ?

## 2021-05-18 ENCOUNTER — Other Ambulatory Visit: Payer: Self-pay | Admitting: Family Medicine

## 2021-05-18 DIAGNOSIS — R7989 Other specified abnormal findings of blood chemistry: Secondary | ICD-10-CM

## 2021-05-18 NOTE — Telephone Encounter (Signed)
Requested medication (s) are due for refill today:   rovider to review ? ?Requested medication (s) are on the active medication list:   Yes ? ?Future visit scheduled:   No    Seen a month ago ? ? ?Last ordered: 03/23/2021 #8, 0 refills ? ?Returned because it's a non delegated refill  ? ?Requested Prescriptions  ?Pending Prescriptions Disp Refills  ? Vitamin D, Ergocalciferol, (DRISDOL) 1.25 MG (50000 UNIT) CAPS capsule [Pharmacy Med Name: VITAMIN D2 1.'25MG'$ (50,000 UNIT)] 4 capsule 1  ?  Sig: Take 1 capsule (50,000 Units total) by mouth every 7 (seven) days. Take for 8 total doses(weeks)  ?  ? Endocrinology:  Vitamins - Vitamin D Supplementation 2 Failed - 05/18/2021  4:35 AM  ?  ?  Failed - Manual Review: Route requests for 50,000 IU strength to the provider  ?  ?  Failed - Vitamin D in normal range and within 360 days  ?  Vit D, 25-Hydroxy  ?Date Value Ref Range Status  ?03/21/2021 25.9 (L) 30.0 - 100.0 ng/mL Final  ?  Comment:  ?  Vitamin D deficiency has been defined by the Institute of ?Medicine and an Endocrine Society practice guideline as a ?level of serum 25-OH vitamin D less than 20 ng/mL (1,2). ?The Endocrine Society went on to further define vitamin D ?insufficiency as a level between 21 and 29 ng/mL (2). ?1. IOM (Institute of Medicine). 2010. Dietary reference ?   intakes for calcium and D. [place]: The ?   [employer]. ?2. Holick MF, Binkley Beaver, Bischoff-Ferrari HA, et al. ?   Evaluation, treatment, and prevention of vitamin D ?   deficiency: an Endocrine Society clinical practice ?   guideline. JCEM. 2011 Jul; 96(7):1911-30. ?  ?  ?  ?  ?  Passed - Ca in normal range and within 360 days  ?  Calcium  ?Date Value Ref Range Status  ?03/21/2021 9.1 8.7 - 10.2 mg/dL Final  ?  ?  ?  ?  Passed - Valid encounter within last 12 months  ?  Recent Outpatient Visits   ? ?      ? 1 month ago Current episode of major depressive disorder without prior episode, unspecified depression episode severity  ?  [facility] Montel Culver, MD  ? 2 months ago Annual physical exam  ? [facility] Montel Culver, MD  ? ?  ?  ?Future Appointments   ? ?        ? In 2 months Rodenbough, Minette Brine, PsyD [facility], CPR  ? ?  ? ?  ?  ?  ? ?

## 2021-05-24 NOTE — Telephone Encounter (Signed)
Noted medication changes per covering provider Dr.Hisada. ?

## 2021-05-25 NOTE — Telephone Encounter (Signed)
FYI

## 2021-05-30 ENCOUNTER — Other Ambulatory Visit: Payer: Self-pay | Admitting: Psychiatry

## 2021-05-30 DIAGNOSIS — F320 Major depressive disorder, single episode, mild: Secondary | ICD-10-CM

## 2021-06-01 ENCOUNTER — Other Ambulatory Visit: Payer: Self-pay | Admitting: Family Medicine

## 2021-06-01 DIAGNOSIS — F329 Major depressive disorder, single episode, unspecified: Secondary | ICD-10-CM

## 2021-06-01 NOTE — Telephone Encounter (Signed)
rx dose was increased on 05/08/21 by provider ?Requested Prescriptions  ?Pending Prescriptions Disp Refills  ?? sertraline (ZOLOFT) 25 MG tablet [Pharmacy Med Name: SERTRALINE HCL 25 MG TABLET] 90 tablet 0  ?  Sig: TAKE 1 TABLET (25 MG TOTAL) BY MOUTH DAILY.  ?  ? Not Delegated - Psychiatry:  Antidepressants - SSRI - sertraline Failed - 06/01/2021  2:25 AM  ?  ?  Failed - This refill cannot be delegated  ?  ?  Passed - AST in normal range and within 360 days  ?  AST  ?Date Value Ref Range Status  ?03/21/2021 33 0 - 40 IU/L Final  ?   ?  ?  Passed - ALT in normal range and within 360 days  ?  ALT  ?Date Value Ref Range Status  ?03/21/2021 32 0 - 44 IU/L Final  ?   ?  ?  Passed - Completed PHQ-2 or PHQ-9 in the last 360 days  ?  ?  Passed - Valid encounter within last 6 months  ?  Recent Outpatient Visits   ?      ? 1 month ago Current episode of major depressive disorder without prior episode, unspecified depression episode severity  ? [facility] Montel Culver, MD  ? 2 months ago Annual physical exam  ? [facility] Montel Culver, MD  ?  ?  ?Future Appointments   ?        ? In 1 month Rodenbough, Minette Brine, PsyD [facility] Medicine [facility] Rehabilitation, CPR  ?  ? ?  ?  ?  ? ? ?

## 2021-06-08 ENCOUNTER — Other Ambulatory Visit: Payer: Self-pay | Admitting: Psychiatry

## 2021-06-14 ENCOUNTER — Ambulatory Visit (INDEPENDENT_AMBULATORY_CARE_PROVIDER_SITE_OTHER): Payer: 59 | Admitting: Psychiatry

## 2021-06-14 ENCOUNTER — Encounter: Payer: Self-pay | Admitting: Psychiatry

## 2021-06-14 VITALS — BP 125/79 | HR 56 | Temp 98.7°F | Wt 201.6 lb

## 2021-06-14 DIAGNOSIS — F4010 Social phobia, unspecified: Secondary | ICD-10-CM

## 2021-06-14 DIAGNOSIS — R41840 Attention and concentration deficit: Secondary | ICD-10-CM | POA: Diagnosis not present

## 2021-06-14 DIAGNOSIS — Z789 Other specified health status: Secondary | ICD-10-CM

## 2021-06-14 DIAGNOSIS — F320 Major depressive disorder, single episode, mild: Secondary | ICD-10-CM | POA: Diagnosis not present

## 2021-06-14 MED ORDER — TRAZODONE HCL 50 MG PO TABS: 25.0000 mg | ORAL_TABLET | Freq: Every evening | ORAL | 1 refills | Status: DC | PRN

## 2021-06-14 NOTE — Progress Notes (Signed)
[place] MD OP Progress Note ? ?06/14/2021 4:03 PM ?Terry Summers  ?MRN:  [id number] ? ?Chief Complaint:  ?Chief Complaint  ?Patient presents with  ? Follow-up: 36 year old Caucasian male with history of MDD, social anxiety disorder, attention and concentration deficit, with recent adverse side effects to sertraline presented for medication management.  ? ?HPI: Terry Summers is a 36 year old Caucasian male, employed, married, lives in [place], has a history of MDD, social anxiety disorder, significant use of alcohol, vitamin D deficiency was evaluated in office today. ? ?Patient today reports he is currently compliant on the Lexapro which was recently started.  Patient developed side effects to sertraline, had nausea as well as sexual side effects.  He hence was weaned off of it.  Currently tolerating the Lexapro. ? ?Patient reports mood symptoms as improving.  Reports appetite as fair. ? ?Has been managing his anxiety better than before. ? ?He does struggle with sleep.  Reports he has no difficulty falling asleep however sleep is interrupted.  He has to wake up at 4:30 in the morning to go to work.  He goes to bed at 9 PM.  Interested in a sleep medication.  Agreeable to a trial of trazodone. ? ?Patient denies any suicidality, homicidality or perceptual disturbances. ? ?Interested in starting some kind of exercise routine.  Currently working on that. ? ?Reports work as going well. ? ?Denies any other concerns today. ? ?Visit Diagnosis:  ?  ICD-10-CM   ?1. Current mild episode of major depressive disorder without prior episode (HCC)  F32.0 traZODone (DESYREL) 50 MG tablet  ?  ?2. Social anxiety disorder  F40.10   ?  ?3. Attention and concentration deficit  R41.840   ?  ?4. Significant use of alcohol  Z78.9   ?  ? ? ?Past Psychiatric History: Reviewed past psychiatric history from progress note on 05/08/2021-past trials of sertraline-sexual side effects, nausea. ? ?Past Medical History:  ?Past Medical History:  ?Diagnosis Date  ?  Anxiety   ? Depression   ?  ?Past Surgical History:  ?Procedure Laterality Date  ? APPENDECTOMY  2011  ? LASIK Bilateral 2013  ? VASECTOMY  2019  ? WISDOM TOOTH EXTRACTION Bilateral 2006  ? ? ?Family Psychiatric History: Reviewed family psychiatric history from progress note on 05/08/2021 ? ?Family History:  ?Family History  ?Problem Relation Age of Onset  ? Stroke Paternal Grandfather   ? Stroke Paternal Grandmother   ? ADD / ADHD Son   ? Prostate cancer Neg Hx   ? Kidney cancer Neg Hx   ? Bladder Cancer Neg Hx   ? ? ?Social History: Reviewed social history from progress note on 05/08/2021 ?Social History  ? ?Socioeconomic History  ? Marital status: Married  ?  Spouse name: Terry Summers  ? Number of children: 2  ? Years of education: 39  ? Highest education level: Associate degree: academic program  ?Occupational History  ? Not on file  ?Tobacco Use  ? Smoking status: Never  ? Smokeless tobacco: Never  ?Vaping Use  ? Vaping Use: Never used  ?Substance and Sexual Activity  ? Alcohol use: Yes  ?  Alcohol/week: 15.0 standard drinks  ?  Types: 15 Cans of beer per week  ? Drug use: Not Currently  ? Sexual activity: Yes  ?  Partners: Female  ?  Birth control/protection: Surgical  ?Other Topics Concern  ? Not on file  ?Social History Narrative  ? Not on file  ? ?Social Determinants of Health  ? ?  Financial Resource Strain: Not on file  ?Food Insecurity: Not on file  ?Transportation Needs: Not on file  ?Physical Activity: Not on file  ?Stress: Not on file  ?Social Connections: Not on file  ? ? ?Allergies:  ?Allergies  ?Allergen Reactions  ? Penicillins Other (See Comments)  ?  Pt states rash/unknown "I was a baby when it happened".   ? ? ?Metabolic Disorder Labs: ?No results found for: HGBA1C, MPG ?No results found for: PROLACTIN ?Lab Results  ?Component Value Date  ? CHOL 235 (H) 03/21/2021  ? TRIG 129 03/21/2021  ? HDL 75 03/21/2021  ? CHOLHDL 3.1 03/21/2021  ? LDLCALC 138 (H) 03/21/2021  ? ?Lab Results  ?Component Value  Date  ? TSH 3.200 03/21/2021  ? ? ?Therapeutic Level Labs: ?No results found for: LITHIUM ?No results found for: VALPROATE ?No components found for:  CBMZ ? ?Current Medications: ?Current Outpatient Medications  ?Medication Sig Dispense Refill  ? escitalopram (LEXAPRO) 10 MG tablet 5 MG DAILY FOR ONE WEEK, THEN 10 MG DAILY 90 tablet 0  ? hydrOXYzine (VISTARIL) 25 MG capsule TAKE 1-2 CAPSULES (25-50 MG TOTAL) BY MOUTH AT BEDTIME AS NEEDED. FOR SLEEP 180 capsule 0  ? traZODone (DESYREL) 50 MG tablet Take 0.5-1 tablets (25-50 mg total) by mouth at bedtime as needed for sleep. 30 tablet 1  ? Vitamin D, Ergocalciferol, (DRISDOL) 1.25 MG (50000 UNIT) CAPS capsule Take 1 capsule (50,000 Units total) by mouth every 7 (seven) days. Take for 8 total doses(weeks) 8 capsule 0  ? ?No current facility-administered medications for this visit.  ? ? ? ?Musculoskeletal: ?Strength & Muscle Tone: within normal limits ?Gait & Station: normal ?Patient leans: N/A ? ?Psychiatric Specialty Exam: ?Review of Systems  ?Psychiatric/Behavioral:  Positive for dysphoric mood (improving) and sleep disturbance. The patient is nervous/anxious (improving).   ?All other systems reviewed and are negative.  ?Blood pressure 125/79, pulse (!) 56, temperature 98.7 ?F (37.1 ?C), temperature source Temporal, weight 201 lb 9.6 oz (91.4 kg).Body mass index is 28.93 kg/m?.  ?General Appearance: Casual  ?Eye Contact:  Fair  ?Speech:  Clear and Coherent  ?Volume:  Normal  ?Mood:  Anxious and Depressed improving  ?Affect:  Congruent  ?Thought Process:  Goal Directed and Descriptions of Associations: Intact  ?Orientation:  Full (Time, Place, and Person)  ?Thought Content: Logical   ?Suicidal Thoughts:  No  ?Homicidal Thoughts:  No  ?Memory:  Immediate;   Fair ?Recent;   Fair ?Remote;   Fair  ?Judgement:  Fair  ?Insight:  Fair  ?Psychomotor Activity:  Normal  ?Concentration:  Concentration: Fair and Attention Span: Fair  ?Recall:  Fair  ?Fund of Knowledge: Fair   ?Language: Fair  ?Akathisia:  No  ?Handed:  Right  ?AIMS (if indicated): done  ?Assets:  Communication Skills ?Desire for Improvement ?Housing ?Social Support  ?ADL's:  Intact  ?Cognition: WNL  ?Sleep:  Poor  ? ?Screenings: ?AIMS   ? ?[place] Office Visit from 06/14/2021 in [place] Office Visit from 05/08/2021 in [place]  ?AIMS Total Score 0 0  ? ?  ? ?GAD-7   ? ?[place] Office Visit from 05/08/2021 in [place] Office Visit from 04/17/2021 in [facility] Office Visit from 03/06/2021 in [facility]  ?Total GAD-7 Score '3 2 3  '$ ? ?  ? ?PHQ2-9   ? ?[place] Office Visit from 06/14/2021 in [place] Office Visit from 05/08/2021 in [facility] Office Visit from 04/17/2021 in [facility] Office Visit from 03/06/2021 in [facility]  ?PHQ-2 Total Score '2 2 2 2  '$ ?PHQ-9 Total Score '5 4 3 6  '$ ? ?  ? ?[place] Office Visit from 06/14/2021 in [place]  ?C-SSRS RISK CATEGORY No Risk  ? ?  ? ? ? ?Assessment and Plan: Terry Summers is a 36 year old Caucasian male, employed, married, lives in [place] has a history of depression, anxiety, sleep problems, side effect of sertraline, currently tolerating Lexapro with good response.  Patient will benefit from the following plan. ? ?Plan ?MDD in partial remission ?Continue Lexapro 10 mg p.o. daily ?Hydroxyzine 25-50 mg p.o. nightly as needed for anxiety ?Start trazodone 25-50 mg p.o. nightly as needed for sleep.  Discussed sleep hygiene techniques.  Provided medication education, discussed serotonin syndrome. ?Patient was referred for CBT-pending ? ?Social anxiety disorder-unstable ?Referred for CBT ?Continue Lexapro as prescribed ? ?Attention and concentration deficit-unstable ?Patient has been referred for neuropsychological testing. ? ?Significant use of  alcohol-patient currently is cutting back. ? ?Follow-up in clinic in 4 weeks or sooner if needed. ? ?This note was generated in part or whole with voice recognition software. Voice recognition is usually quite ac

## 2021-06-14 NOTE — Patient Instructions (Signed)
Trazodone Tablets What is this medication? TRAZODONE (TRAZ oh done) treats depression. It increases the amount of serotonin in the brain, a hormone that helps regulate mood. This medicine may be used for other purposes; ask your health care provider or pharmacist if you have questions. COMMON BRAND NAME(S): Desyrel What should I tell my care team before I take this medication? They need to know if you have any of these conditions: Attempted suicide or thinking about it Bipolar disorder Bleeding problems Glaucoma Heart disease, or previous heart attack Irregular heart beat Kidney or liver disease Low levels of sodium in the blood An unusual or allergic reaction to trazodone, other medications, foods, dyes or preservatives Pregnant or trying to get pregnant Breast-feeding How should I use this medication? Take this medication by mouth with a glass of water. Follow the directions on the prescription label. Take this medication shortly after a meal or a light snack. Take your medication at regular intervals. Do not take your medication more often than directed. Do not stop taking this medication suddenly except upon the advice of your care team. Stopping this medication too quickly may cause serious side effects or your condition may worsen. A special MedGuide will be given to you by the pharmacist with each prescription and refill. Be sure to read this information carefully each time. Talk to your care team regarding the use of this medication in children. Special care may be needed. Overdosage: If you think you have taken too much of this medicine contact a poison control center or emergency room at once. NOTE: This medicine is only for you. Do not share this medicine with others. What if I miss a dose? If you miss a dose, take it as soon as you can. If it is almost time for your next dose, take only that dose. Do not take double or extra doses. What may interact with this medication? Do not  take this medication with any of the following: Certain medications for fungal infections like fluconazole, itraconazole, ketoconazole, posaconazole, voriconazole Cisapride Dronedarone Linezolid MAOIs like Carbex, Eldepryl, Marplan, Nardil, and Parnate Mesoridazine Methylene blue (injected into a vein) Pimozide Saquinavir Thioridazine This medication may also interact with the following: Alcohol Antiviral medications for HIV or AIDS Aspirin and aspirin-like medications Barbiturates like phenobarbital Certain medications for blood pressure, heart disease, irregular heart beat Certain medications for depression, anxiety, or psychotic disturbances Certain medications for migraine headache like almotriptan, eletriptan, frovatriptan, naratriptan, rizatriptan, sumatriptan, zolmitriptan Certain medications for seizures like carbamazepine and phenytoin Certain medications for sleep Certain medications that treat or prevent blood clots like dalteparin, enoxaparin, warfarin Digoxin Fentanyl Lithium NSAIDS, medications for pain and inflammation, like ibuprofen or naproxen Other medications that prolong the QT interval (cause an abnormal heart rhythm) like dofetilide Rasagiline Supplements like St. John's wort, kava kava, valerian Tramadol Tryptophan This list may not describe all possible interactions. Give your health care provider a list of all the medicines, herbs, non-prescription drugs, or dietary supplements you use. Also tell them if you smoke, drink alcohol, or use illegal drugs. Some items may interact with your medicine. What should I watch for while using this medication? Tell your care team if your symptoms do not get better or if they get worse. Visit your care team for regular checks on your progress. Because it may take several weeks to see the full effects of this medication, it is important to continue your treatment as prescribed by your care team. Watch for new or worsening  thoughts of   suicide or depression. This includes sudden changes in mood, behaviors, or thoughts. These changes can happen at any time but are more common in the beginning of treatment or after a change in dose. Call your care team right away if you experience these thoughts or worsening depression. Manic episodes may happen in patients with bipolar disorder who take this medication. Watch for changes in feelings or behaviors such as feeling anxious, nervous, agitated, panicky, irritable, hostile, aggressive, impulsive, severely restless, overly excited and hyperactive, or trouble sleeping. These changes can happen at any time but are more common in the beginning of treatment or after a change in dose. Call your care team right away if you notice any of these symptoms. You may get drowsy or dizzy. Do not drive, use machinery, or do anything that needs mental alertness until you know how this medication affects you. Do not stand or sit up quickly, especially if you are an older patient. This reduces the risk of dizzy or fainting spells. Alcohol may interfere with the effect of this medication. Avoid alcoholic drinks. This medication may cause dry eyes and blurred vision. If you wear contact lenses you may feel some discomfort. Lubricating drops may help. See your eye doctor if the problem does not go away or is severe. Your mouth may get dry. Chewing sugarless gum, sucking hard candy and drinking plenty of water may help. Contact your care team if the problem does not go away or is severe. What side effects may I notice from receiving this medication? Side effects that you should report to your care team as soon as possible: Allergic reactions--skin rash, itching, hives, swelling of the face, lips, tongue, or throat Bleeding--bloody or black, tar-like stools, red or dark brown urine, vomiting blood or brown material that looks like coffee grounds, small, red or purple spots on skin, unusual bleeding or  bruising Heart rhythm changes--fast or irregular heartbeat, dizziness, feeling faint or lightheaded, chest pain, trouble breathing Low blood pressure--dizziness, feeling faint or lightheaded, blurry vision Low sodium level--muscle weakness, fatigue, dizziness, headache, confusion Prolonged or painful erection Serotonin syndrome--irritability, confusion, fast or irregular heartbeat, muscle stiffness, twitching muscles, sweating, high fever, seizures, chills, vomiting, diarrhea Sudden eye pain or change in vision such as blurry vision, seeing halos around lights, vision loss Thoughts of suicide or self-harm, worsening mood, feelings of depression Side effects that usually do not require medical attention (report to your care team if they continue or are bothersome): Change in sex drive or performance Constipation Dizziness Drowsiness Dry mouth This list may not describe all possible side effects. Call your doctor for medical advice about side effects. You may report side effects to FDA at 1-800-FDA-1088. Where should I keep my medication? Keep out of the reach of children and pets. Store at room temperature between 15 and 30 degrees C (59 to 86 degrees F). Protect from light. Keep container tightly closed. Throw away any unused medication after the expiration date. NOTE: This sheet is a summary. It may not cover all possible information. If you have questions about this medicine, talk to your doctor, pharmacist, or health care provider.  2023 Elsevier/Gold Standard (2020-01-19 00:00:00)  

## 2021-06-20 ENCOUNTER — Ambulatory Visit (INDEPENDENT_AMBULATORY_CARE_PROVIDER_SITE_OTHER): Payer: 59 | Admitting: Surgery

## 2021-06-20 ENCOUNTER — Encounter: Payer: Self-pay | Admitting: Surgery

## 2021-06-20 VITALS — BP 143/90 | HR 69 | Temp 98.4°F | Ht 70.0 in | Wt 202.0 lb

## 2021-06-20 DIAGNOSIS — D171 Benign lipomatous neoplasm of skin and subcutaneous tissue of trunk: Secondary | ICD-10-CM

## 2021-06-20 NOTE — Progress Notes (Signed)
?06/20/2021 ? ?Reason for Visit:  Back lipoma ? ?Requesting Provider:  Rosette Reveal, MD ? ?History of Present Illness: ?Terry Summers is a 36 y.o. male presenting for evaluation of a mass in his right upper back.  The patient reports that he's had it for about 4-5 years.  He has overall remained asymptomatic from it.  Denies any pain, discomfort, burning/tingling, swelling, redness, tenderness, or drainage.  He feels that the mass has remained overall similar size throughout except for one time that he felt the mass had actually gone away, but then came back again.  Denies any other masses.  He got established with a new PCP earlier this year who noted the lipoma and referred him to [place] for further evaluation. ? ?Past Medical History: ?Past Medical History:  ?Diagnosis Date  ? Anxiety   ? Depression   ?  ? ?Past Surgical History: ?Past Surgical History:  ?Procedure Laterality Date  ? APPENDECTOMY  2011  ? LASIK Bilateral 2013  ? VASECTOMY  2019  ? WISDOM TOOTH EXTRACTION Bilateral 2006  ? ? ?Home Medications: ?Prior to Admission medications   ?Medication Sig Start Date End Date Taking? Authorizing Provider  ?escitalopram (LEXAPRO) 10 MG tablet 5 MG DAILY FOR ONE WEEK, THEN 10 MG DAILY 06/08/21  Yes Ursula Alert, MD  ?traZODone (DESYREL) 50 MG tablet Take 0.5-1 tablets (25-50 mg total) by mouth at bedtime as needed for sleep. 06/14/21  Yes Ursula Alert, MD  ? ? ?Allergies: ?Allergies  ?Allergen Reactions  ? Penicillins Other (See Comments)  ?  Pt states rash/unknown "I was a baby when it happened".   ? ? ?Social History: ? reports that he has never smoked. He has never been exposed to tobacco smoke. He has never used smokeless tobacco. He reports current alcohol use of about 15.0 standard drinks per week. He reports that he does not currently use drugs.  ? ?Family History: ?Family History  ?Problem Relation Age of Onset  ? Stroke Paternal Grandfather   ? Stroke Paternal Grandmother   ? ADD / ADHD Son   ? Prostate  cancer Neg Hx   ? Kidney cancer Neg Hx   ? Bladder Cancer Neg Hx   ? ? ?Review of Systems: ?Review of Systems  ?Constitutional:  Negative for chills and fever.  ?Respiratory:  Negative for shortness of breath.   ?Cardiovascular:  Negative for chest pain.  ?Gastrointestinal:  Negative for abdominal pain, nausea and vomiting.  ?Skin:  Negative for rash.  ? ?Physical Exam ?BP (!) 143/90   Pulse 69   Temp 98.4 ?F (36.9 ?C)   Ht '5\' 10"'$  (1.778 m)   Wt 202 lb (91.6 kg)   SpO2 98%   BMI 28.98 kg/m?  ?CONSTITUTIONAL: No acute distress, well nourished. ?HEENT:  Normocephalic, atraumatic, extraocular motion intact. ?RESPIRATORY:  Normal respiratory effort without pathologic use of accessory muscles. ?CARDIOVASCULAR: Regular rhythm and rate. ?MUSCULOSKELETAL:  Normal muscle strength and tone in all four extremities.  No peripheral edema or cyanosis. ?SKIN:  The patient has a 2 cm mass in the upper right back, about midportion of the medial edge of the right scapula.  It is soft, mobile, non-tender.  Consistent with a lipoma.  No skin pore noted. ?NEUROLOGIC:  Motor and sensation is grossly normal.  Cranial nerves are grossly intact. ?PSYCH:  Alert and oriented to person, place and time. Affect is normal. ? ?Laboratory Analysis: ?No results found for this or any previous visit (from the past 24 hour(s)). ? ?Imaging: ?  No results found. ? ?Assessment and Plan: ?This is a 36 y.o. male with a right upper back lipoma. ? ?--Discussed with the patient that the mass he has is likely to be a lipoma, though in theory could be a sebaceous cyst as well.  There's no visible skin pore overlying the mass, so more consistent with lipoma.  Either mass is a benign finding.  The patient is interested in excision of the mass. ?--Discussed with him that we can excise the mass as an office procedure, without needing to do this in the OR.  This would be under local anesthetic only and he's in agreement with that.  Reviewed the procedure at  length with him including risks of bleeding, infection, injury to surrounding structures, post-op activity restrictions, and he's willing to proceed. ?--Will schedule him for office excision early next month per his preference. ? ?I spent 40 minutes dedicated to the care of this patient on the date of this encounter to include pre-visit review of records, face-to-face time with the patient discussing diagnosis and management, and any post-visit coordination of care. ? ? ?Melvyn Neth, MD ?[facility] ? ?  ?

## 2021-06-20 NOTE — Patient Instructions (Signed)
We have scheduled you for an excision of your lipoma of your back. ? ?You do not need a driver for that day.  ? ?

## 2021-07-13 ENCOUNTER — Encounter: Payer: Self-pay | Admitting: Surgery

## 2021-07-13 ENCOUNTER — Other Ambulatory Visit: Payer: Self-pay | Admitting: Surgery

## 2021-07-13 ENCOUNTER — Ambulatory Visit (INDEPENDENT_AMBULATORY_CARE_PROVIDER_SITE_OTHER): Payer: 59 | Admitting: Surgery

## 2021-07-13 VITALS — BP 139/89 | HR 78 | Temp 98.2°F | Ht 70.0 in | Wt 202.0 lb

## 2021-07-13 DIAGNOSIS — L723 Sebaceous cyst: Secondary | ICD-10-CM | POA: Diagnosis not present

## 2021-07-13 DIAGNOSIS — L729 Follicular cyst of the skin and subcutaneous tissue, unspecified: Secondary | ICD-10-CM

## 2021-07-13 NOTE — Patient Instructions (Addendum)
Today we have removed a Cyst in our office.   You are free to shower tomorrow, do not scrub at the area, pat dry.   You have glue on your skin and sutures under the skin. The glue will come off on it's own in 10-14 days. You may shower normally until this occurs but do not submerge.  Please use Tylenol or Ibuprofen for pain as needed. You may uses ice to the area for the next 24-48 hours, this will help with any achiness.  Try to avoid strenuous stretching or pulling of the area for 2 weeks.   We will see you back in 2 weeks to ensure that this has healed and to review the final pathology. Please see your appointment below. You may continue your regular activities right away but if you are having pain while doing something, stop what you are doing and try this activity once again in 3 days. Please call our office with any questions or concerns prior to your appointment. We have removed a Cyst in our office today.   Excision of Skin Cysts or Lesions Excision of a skin lesion refers to the removal of a section of skin by making small cuts (incisions) in the skin. This procedure may be done to remove a cancerous (malignant) or noncancerous (benign) growth on the skin. It is typically done to treat or prevent cancer or infection. It may also be done to improve cosmetic appearance. The procedure may be done to remove: Cancerous growths, such as basal cell carcinoma, squamous cell carcinoma, or melanoma. Noncancerous growths, such as a cyst or lipoma. Growths, such as moles or skin tags, which may be removed for cosmetic reasons.  Various excision or surgical techniques may be used depending on your condition, the location of the lesion, and your overall health. Tell a health care provider about: Any allergies you have. All medicines you are taking, including vitamins, herbs, eye drops, creams, and over-the-counter medicines. Any problems you or family members have had with anesthetic  medicines. Any blood disorders you have. Any surgeries you have had. Any medical conditions you have. Whether you are pregnant or may be pregnant. What are the risks? Generally, this is a safe procedure. However, problems may occur, including: Bleeding. Infection. Scarring. Recurrence of the cyst, lipoma, or cancer. Changes in skin sensation or appearance, such as discoloration or swelling. Reaction to the anesthetics. Allergic reaction to surgical materials or ointments. Damage to nerves, blood vessels, muscles, or other structures. Continued pain.  What happens before the procedure? Ask your health care provider about: Changing or stopping your regular medicines. This is especially important if you are taking diabetes medicines or blood thinners. Taking medicines such as aspirin and ibuprofen. These medicines can thin your blood. Do not take these medicines before your procedure if your health care provider instructs you not to. You may be asked to take certain medicines. You may be asked to stop smoking. You may have an exam or testing. Plan to have someone take you home after the procedure. Plan to have someone help you with activities during recovery. What happens during the procedure? To reduce your risk of infection: Your health care team will wash or sanitize their hands. Your skin will be washed with soap. You will be given a medicine to numb the area (local anesthetic). One of the following excision techniques will be performed. At the end of any of these procedures, antibiotic ointment will be applied as needed. Each of the following  techniques may vary among health care providers and hospitals. Complete Surgical Excision The area of skin that needs to be removed will be marked with a pen. Using a small scalpel or scissors, the surgeon will gently cut around and under the lesion until it is completely removed. The lesion will be placed in a fluid and sent to the lab for  examination. If necessary, bleeding will be controlled with a device that delivers heat (electrocautery). The edges of the wound may be stitched (sutured) together, and a bandage (dressing) will be applied. This procedure may be performed to treat a cancerous growth or a noncancerous cyst or lesion. Excision of a Cyst The surgeon will make an incision on the cyst. The entire cyst will be removed through the incision. The incision may be closed with sutures.  What happens after the procedure? Return to your normal activities as told by your health care provider. Talk with your health care provider to discuss any test results, treatment options, and if necessary, the need for more tests.

## 2021-07-13 NOTE — Progress Notes (Signed)
  Procedure Date:  07/13/2021  Pre-operative Diagnosis:  Mass of right upper back  Post-operative Diagnosis:  Sebaceous cyst of right upper back  Procedure:  Excision of sebaceous cyst of right upper back  Surgeon:  Melvyn Neth, MD  Anesthesia:  5 ml of 1% lidocaine with epi  Estimated Blood Loss:  3 ml  Specimens:  sebaceous cyst  Complications:  None  Indications for Procedure:  This is a 36 y.o. male with diagnosis of a symptomatic mass in the right upper back, thought to be a lipoma.  The patient wishes to have this excised. The risks of bleeding, abscess or infection, injury to surrounding structures, and need for further procedures were all discussed with the patient and he was willing to proceed.  Description of Procedure: The patient was correctly identified at bedside.  The patient was placed supine.  Appropriate time-outs were performed.  The patient's right upper back was prepped and draped in usual sterile fashion.  Local anesthetic was infused intradermally.  A 2.5 cm incision was made over the mass, and scalpel was used to dissect down the skin and subcutaneous tissue.  After dissecting below the dermis, the mass was found and was determined that it was actually a sebaceous cyst rather than a lipoma.  Skin flaps were created sharply, and then the cyst was excised intact.  It was sent off to pathology.  The cavity was then irrigated and hemostasis was good.  The wound was then closed in two layers using 3-0 Vicryl and 4-0 Monocryl.  The incision was cleaned and sealed with DermaBond.  The patient tolerated the procedure well and all sharps were appropriately disposed of at the end of the case.  --Patient may shower tomorrow, but do not submerge the incision. --May take Tylenol or Ibuprofen for pain control --May apply ice packs to the wound for comfort. --Follow up next week for wound check.   Melvyn Neth, MD

## 2021-07-17 ENCOUNTER — Ambulatory Visit: Payer: Self-pay | Admitting: Psychology

## 2021-07-18 ENCOUNTER — Ambulatory Visit: Payer: 59 | Admitting: Psychiatry

## 2021-07-19 ENCOUNTER — Encounter: Payer: Self-pay | Admitting: Physician Assistant

## 2021-07-19 ENCOUNTER — Other Ambulatory Visit: Payer: Self-pay

## 2021-07-19 ENCOUNTER — Ambulatory Visit (INDEPENDENT_AMBULATORY_CARE_PROVIDER_SITE_OTHER): Payer: 59 | Admitting: Physician Assistant

## 2021-07-19 VITALS — BP 130/85 | HR 60 | Temp 98.0°F | Ht 70.0 in | Wt 205.0 lb

## 2021-07-19 DIAGNOSIS — L729 Follicular cyst of the skin and subcutaneous tissue, unspecified: Secondary | ICD-10-CM

## 2021-07-19 DIAGNOSIS — L723 Sebaceous cyst: Secondary | ICD-10-CM

## 2021-07-19 DIAGNOSIS — Z09 Encounter for follow-up examination after completed treatment for conditions other than malignant neoplasm: Secondary | ICD-10-CM

## 2021-07-19 NOTE — Patient Instructions (Signed)
Please call office with any questions or concerns.

## 2021-07-19 NOTE — Progress Notes (Signed)
[facility] POST-OP OFFICE VISIT  07/19/2021  HPI: Terry Summers is a 36 y.o. male 6 days s/p excision of sebaceous cyst of right upper back with Dr Hampton Abbot  He is doing well No issues with pain, erythema, or drainage No fever, chills Otherwise doing well  Vital signs: BP 130/85   Pulse 60   Temp 98 F (36.7 C) (Oral)   Ht '5\' 10"'$  (1.778 m)   Wt 205 lb (93 kg)   SpO2 96%   BMI 29.41 kg/m    Physical Exam: Constitutional: Well appearing male, NAD Skin: Incision to the right upper back is well healed, no erythema  Assessment/Plan: This is a 36 y.o. male 6 days s/p excision of sebaceous cyst of right upper back   - Pain control prn  - Reviewed wound care recommendation  - Reviewed surgical pathology; EIC  - He can follow up on as needed basis; He understands to call with questions/concerns  -- Edison Simon, PA-C [facility] 07/19/2021, 3:16 PM M-F: 7am - 4pm

## 2021-07-30 ENCOUNTER — Ambulatory Visit: Payer: 59 | Admitting: Psychiatry

## 2021-08-10 ENCOUNTER — Telehealth: Payer: Self-pay

## 2021-08-10 NOTE — Telephone Encounter (Signed)
pt left message that he wants to come off the escitalopram and needs to know how to come taper off of it. he states he was having side affects but he did not state symptoms.

## 2021-08-10 NOTE — Telephone Encounter (Signed)
When he calls back, please make sure that his perceived side effects started after taking escitalopram, and not other medication. If he wants to discontinue escitalopram, I would advise him to take escitalopram 5 mg daily for one week, then discontinue. He will need sooner follow up appointment with Dr. Shea Evans. Will relay this message to her so that she is aware.

## 2021-08-10 NOTE — Telephone Encounter (Signed)
left message for patient to call our office back. Pt also needs to make a office visit with dr. Shea Evans when he calls back.

## 2021-08-15 NOTE — Telephone Encounter (Signed)
Attempted to contact patient to discuss his medication side effect.  Left a voicemail to contact the office back to schedule an appointment since he has not been seen in the past 2 months.  I will also send a message to the staff here to contact patient to schedule appointment.

## 2021-08-20 ENCOUNTER — Encounter: Payer: Self-pay | Admitting: Family Medicine

## 2021-08-21 NOTE — Telephone Encounter (Signed)
Please advise Vasectomy 2019 [facility]

## 2021-08-22 ENCOUNTER — Encounter: Payer: Self-pay | Admitting: Psychiatry

## 2021-08-22 ENCOUNTER — Ambulatory Visit (INDEPENDENT_AMBULATORY_CARE_PROVIDER_SITE_OTHER): Payer: 59 | Admitting: Psychiatry

## 2021-08-22 VITALS — BP 149/101 | HR 82 | Temp 97.8°F | Wt 209.8 lb

## 2021-08-22 DIAGNOSIS — F320 Major depressive disorder, single episode, mild: Secondary | ICD-10-CM

## 2021-08-22 DIAGNOSIS — Z79899 Other long term (current) drug therapy: Secondary | ICD-10-CM

## 2021-08-22 DIAGNOSIS — R41840 Attention and concentration deficit: Secondary | ICD-10-CM

## 2021-08-22 DIAGNOSIS — F4010 Social phobia, unspecified: Secondary | ICD-10-CM | POA: Diagnosis not present

## 2021-08-22 DIAGNOSIS — F324 Major depressive disorder, single episode, in partial remission: Secondary | ICD-10-CM | POA: Diagnosis not present

## 2021-08-22 DIAGNOSIS — F1010 Alcohol abuse, uncomplicated: Secondary | ICD-10-CM | POA: Diagnosis not present

## 2021-08-22 NOTE — Patient Instructions (Addendum)
Hot line AA meeting - [phone]  Alchol and drug services - [place] [place], [address], [place] [address] [phone]  [facility]  [address] , [address] [place], [place] [address]        Naltrexone tablets What is this medication? NALTREXONE (nal TREX one) helps you to remain free of your dependence on opiate drugs or alcohol. It blocks the 'high' that these substances can give you. This medicine is combined with counseling and support groups. This medicine may be used for other purposes; ask your health care provider or pharmacist if you have questions. COMMON BRAND NAME(S): Depade, ReVia What should I tell my care team before I take this medication? They need to know if you have any of these conditions: if you have used drugs or alcohol within 7 to 10 days kidney disease liver disease, including hepatitis an unusual or allergic reaction to naltrexone, other medicines, foods, dyes, or preservatives pregnant or trying to get pregnant breast-feeding How should I use this medication? Take this medicine by mouth with a full glass of water. Follow the directions on the prescription label. Do not take this medicine within 7 to 10 days of taking any opioid drugs. Take your medicine at regular intervals. Do not take your medicine more often than directed. Do not stop taking except on your doctor's advice. Talk to your pediatrician regarding the use of this medicine in children. Special care may be needed. Overdosage: If you think you have taken too much of this medicine contact a poison control center or emergency room at once. NOTE: This medicine is only for you. Do not share this medicine with others. What if I miss a dose? If you miss a dose and remember on the same day, take the missed dose. If you do not remember until the next day, ask your doctor or health care professional about  rescheduling your doses. Do not take double or extra doses. What may interact with this medication? Do not take this medicine with any of the following medications: any prescription or street opioid drug like codiene, heroin, methadone This medicine may also interact with the following medications: disulfiram thioridazine This list may not describe all possible interactions. Give your health care provider a list of all the medicines, herbs, non-prescription drugs, or dietary supplements you use. Also tell them if you smoke, drink alcohol, or use illegal drugs. Some items may interact with your medicine. What should I watch for while using this medication? Your condition will be monitored carefully while you are receiving this medicine. Visit your doctor or health care professional regularly. For this medicine to be most effective you should attend any counseling or support groups that your doctor or health care professional recommends. Do not try to overcome the effects of the medicine by taking large amounts of narcotics or by drinking large amounts of alcohol. This can cause severe problems including death. Also, you may be more sensitive to lower doses of narcotics after you stop taking this medicine. If you are going to have surgery, tell your doctor or health care professional that you are taking this medicine. Do not treat yourself for coughs, colds, pain, or diarrhea. Ask your doctor or health care professional for advice. Some of the ingredients may interact with this medicine and cause side effects. Wear a medical ID bracelet or chain, and carry a card that describes your disease  and details of your medicine and dosage times. You may get drowsy or dizzy. Do not drive, use machinery, or do anything that needs mental alertness until you know how this medicine affects you. Do not stand or sit up quickly, especially if you are an older patient. This reduces the risk of dizzy or fainting spells.  Alcohol may interfere with the effect of this medicine. Avoid alcoholic drinks. What side effects may I notice from receiving this medication? Side effects that you should report to your doctor or health care professional as soon as possible: allergic reactions like skin rash, itching or hives, swelling of the face, lips, or tongue breathing problems changes in vision, hearing confusion dark urine depressed mood diarrhea fast or irregular heart beat hallucination, loss of contact with reality light-colored stools right upper belly pain suicidal thoughts or other mood changes unusually weak or tired vomiting yellowing of the eyes or skin Side effects that usually do not require medical attention (report to your doctor or health care professional if they continue or are bothersome): aches, pains change in sex drive or performance feeling anxious headache loss of appetite, nausea runny nose, sinus problems, sneezing stomach pain trouble sleeping This list may not describe all possible side effects. Call your doctor for medical advice about side effects. You may report side effects to FDA at 1-800-FDA-1088. Where should I keep my medication? Keep out of the reach of children. Store at room temperature between 20 and 25 degrees C (68 and 77 degrees F). Throw away any unused medicine after the expiration date. NOTE: This sheet is a summary. It may not cover all possible information. If you have questions about this medicine, talk to your doctor, pharmacist, or health care provider.  2023 Elsevier/Gold Standard (2011-11-23 00:00:00)

## 2021-08-22 NOTE — Progress Notes (Unsigned)
[facility] MD OP Progress Note  08/22/2021 5:25 PM Terry Summers  MRN:  [id number]  Chief Complaint:  Chief Complaint  Patient presents with   Follow-up: 36 year old Caucasian male with history of depression, anxiety, alcoholism, presented for medication management.   HPI: Terry Summers is a 36 year old Caucasian male, employed, married, lives in [place], has a history of MDD, social anxiety, alcohol use, significant, vitamin D deficiency was evaluated in office today.  Patient today returns for a follow-up visit, reports he stopped taking the Lexapro since he felt it was making him worse and he also had sexual side effects.  Since stopping the Lexapro patient reports mood wise he has been doing better.  Does not feel as depressed as he used to before.  Patient also reports anxiety is improving.  He is not interested in adding a medication for his mood at this time.  Patient reports sleep is good on the trazodone which he uses as needed.  Denies side effects.  Patient continues to have attention and focus deficit, reports he has upcoming appointment for neuropsychological testing.  Patient reports he currently struggles with alcohol use.  Patient reports he drinks around 5 years/day Monday through Thursday and then the weekend he drinks 10 beers per day.  Patient reports he has a lot of craving.  Patient reports his wife is concerned about his drinking.  Patient reports he started gradually increasing the amount of alcohol he consumed over the past several months.  Patient reports he is interested in getting treatment for his alcoholism.  Agreeable to go to a treatment program if needed.  Patient denies any suicidality, homicidality or perceptual disturbances.  Patient denies any other concerns today.  Visit Diagnosis:    ICD-10-CM   1. Current mild episode of major depressive disorder without prior episode ([facility])  F32.0     2. Social anxiety disorder  F40.10     3. Attention and concentration  deficit  R41.840     4. Alcohol use disorder, mild, abuse  F10.10 Renal function panel    Hepatic function panel    5. High risk medication use  Z79.899 Renal function panel    Hepatic function panel      Past Psychiatric History: Reviewed past psychiatric history from progress note on 05/08/2021.  Past trials of sertraline-sexual side effects, nausea, Lexapro-side effects.  Past Medical History:  Past Medical History:  Diagnosis Date   Anxiety    Depression     Past Surgical History:  Procedure Laterality Date   APPENDECTOMY  2011   LASIK Bilateral 2013   VASECTOMY  2019   WISDOM TOOTH EXTRACTION Bilateral 2006    Family Psychiatric History: Reviewed family psychiatric history from progress note on 05/08/2021.  Family History:  Family History  Problem Relation Age of Onset   Stroke Paternal Grandfather    Stroke Paternal Grandmother    ADD / ADHD Son    Prostate cancer Neg Hx    Kidney cancer Neg Hx    Bladder Cancer Neg Hx     Social History: Reviewed social history from progress note on 05/08/2021. Social History   Socioeconomic History   Marital status: Married    Spouse name: Terry Summers   Number of children: 2   Years of education: 14   Highest education level: Associate degree: academic program  Occupational History   Not on file  Tobacco Use   Smoking status: Never    Passive exposure: Never   Smokeless tobacco: Never  Vaping Use   Vaping Use: Never used  Substance and Sexual Activity   Alcohol use: Yes    Alcohol/week: 15.0 standard drinks of alcohol    Types: 15 Cans of beer per week   Drug use: Not Currently   Sexual activity: Yes    Partners: Female    Birth control/protection: Surgical  Other Topics Concern   Not on file  Social History Narrative   Not on file   Social Determinants of Health   Financial Resource Strain: Not on file  Food Insecurity: Not on file  Transportation Needs: Not on file  Physical Activity: Not on file   Stress: Not on file  Social Connections: Not on file    Allergies:  Allergies  Allergen Reactions   Penicillins Other (See Comments)    Pt states rash/unknown "I was a baby when it happened".     Metabolic Disorder Labs: No results found for: "HGBA1C", "MPG" No results found for: "PROLACTIN" Lab Results  Component Value Date   CHOL 235 (H) 03/21/2021   TRIG 129 03/21/2021   HDL 75 03/21/2021   CHOLHDL 3.1 03/21/2021   LDLCALC 138 (H) 03/21/2021   Lab Results  Component Value Date   TSH 3.200 03/21/2021    Therapeutic Level Labs: No results found for: "LITHIUM" No results found for: "VALPROATE" No results found for: "CBMZ"  Current Medications: Current Outpatient Medications  Medication Sig Dispense Refill   traZODone (DESYREL) 50 MG tablet Take 0.5-1 tablets (25-50 mg total) by mouth at bedtime as needed for sleep. 30 tablet 1   No current facility-administered medications for this visit.     Musculoskeletal: Strength & Muscle Tone: within normal limits Gait & Station: normal Patient leans: N/A  Psychiatric Specialty Exam: Review of Systems  Musculoskeletal:        BL hands cramps   Psychiatric/Behavioral:  The patient is nervous/anxious.   All other systems reviewed and are negative.   Blood pressure (!) 149/101, pulse 82, temperature 97.8 F (36.6 C), temperature source Temporal, weight 209 lb 12.8 oz (95.2 kg).Body mass index is 30.1 kg/m.  General Appearance: Casual  Eye Contact:  Good  Speech:  Clear and Coherent  Volume:  Normal  Mood:  Anxious  Affect:  Appropriate  Thought Process:  Goal Directed and Descriptions of Associations: Intact  Orientation:  Full (Time, Place, and Person)  Thought Content: Logical   Suicidal Thoughts:  No  Homicidal Thoughts:  No  Memory:  Immediate;   Fair Recent;   Fair Remote;   Fair  Judgement:  Fair  Insight:  Fair  Psychomotor Activity:  Normal  Concentration:  Concentration: Fair and Attention Span:  Fair  Recall:  [employer] of Knowledge: Fair  Language: Fair  Akathisia:  No  Handed:  Right  AIMS (if indicated): not done  Assets:  Communication Skills Desire for [place] Talents/Skills Transportation  ADL's:  Intact  Cognition: WNL  Sleep:  Fair   Screenings: [place] Office Visit from 06/14/2021 in [facility] Office Visit from 05/08/2021 in [facility] Total Score 0 0      GAD-7    [place] Visit from 05/08/2021 in [facility] Office Visit from 04/17/2021 in [facility] Office Visit from 03/06/2021 in [facility]  Total GAD-7 Score '3 2 3      '$ PHQ2-9    [place] Office Visit  from 08/22/2021 in [facility] Visit from 06/14/2021 in [facility] Visit from 05/08/2021 in [facility] Office Visit from 04/17/2021 in [facility] Office Visit from 03/06/2021 in [facility]  PHQ-2 Total Score '2 2 2 2 2  '$ PHQ-9 Total Score '4 5 4 3 6      '$ [place] Office Visit from 08/22/2021 in [facility] Office Visit from 06/14/2021 in [facility] No Risk No Risk        Assessment and Plan: Terry Summers is a 36 year old Caucasian male, employed, married, lives in [place], has a history of depression, anxiety, alcoholism was evaluated in office today.  Patient is currently interested in treatment for alcoholism, discussed plan as noted below.  Plan MDD in partial remission Discontinue Lexapro for side effects. Hydroxyzine 25-50 mg p.o. nightly as needed Continue trazodone 25-50 mg p.o. nightly as needed Patient was referred for CBT-pending  Social anxiety disorder-unstable Referred for CBT.  Patient is not interested in  medications at this time.  Attention and concentration deficit-unstable Referral for neuropsychological testing.  Has upcoming appointment  Alcohol use disorder-unstable Provided information for AA meetings.  Also refer to treatment programs in the [facility], alcohol and drug treatment service, [facility]. Discussed starting naltrexone for his alcoholism.  However will get labs done prior to that.  High risk medication use-will order liver function test, renal function test.  Provided lab slip.  Patient with elevated blood pressure reading in session today reports he rushed in here through traffic which could have caused it.  Patient to follow up with primary care provider for the same.  Patient also with bilateral hand cramps-needs to follow up with primary care provider for the same.  Follow-up in clinic in 4 to 6 weeks or sooner if needed.  This note was generated in part or whole with voice recognition software. Voice recognition is usually quite accurate but there are transcription errors that can and very often do occur. I apologize for any typographical errors that were not detected and corrected.   Ursula Alert, MD 08/22/2021, 5:25 PM

## 2021-08-23 ENCOUNTER — Telehealth: Payer: Self-pay | Admitting: Urology

## 2021-08-23 NOTE — Telephone Encounter (Signed)
Patient called asking to have his sperm rechecked after his 2019 vasectomy. He had a negative sample but his wife never stopped taking her birth control and now wants to stop but they want to make sure its still negative. He is aware that it will be a self pay.   Sharyn Lull

## 2021-08-24 ENCOUNTER — Other Ambulatory Visit: Payer: Self-pay

## 2021-08-24 DIAGNOSIS — Z9852 Vasectomy status: Secondary | ICD-10-CM

## 2021-08-24 NOTE — Addendum Note (Signed)
Addended by: Evelina Bucy on: 08/24/2021 11:57 AM   Modules accepted: Orders

## 2021-08-25 LAB — POST-VAS SPERM EVALUATION,QUAL: Volume: 3.6 mL

## 2021-08-26 ENCOUNTER — Encounter: Payer: Self-pay | Admitting: Urology

## 2021-08-31 ENCOUNTER — Other Ambulatory Visit: Payer: Self-pay | Admitting: Psychiatry

## 2021-08-31 DIAGNOSIS — F320 Major depressive disorder, single episode, mild: Secondary | ICD-10-CM

## 2021-10-03 ENCOUNTER — Encounter: Payer: Self-pay | Admitting: Psychiatry

## 2021-10-03 ENCOUNTER — Ambulatory Visit (INDEPENDENT_AMBULATORY_CARE_PROVIDER_SITE_OTHER): Payer: 59 | Admitting: Psychiatry

## 2021-10-03 VITALS — BP 131/88 | HR 69 | Ht 70.0 in | Wt 204.0 lb

## 2021-10-03 DIAGNOSIS — F4010 Social phobia, unspecified: Secondary | ICD-10-CM | POA: Diagnosis not present

## 2021-10-03 DIAGNOSIS — F3342 Major depressive disorder, recurrent, in full remission: Secondary | ICD-10-CM

## 2021-10-03 DIAGNOSIS — F324 Major depressive disorder, single episode, in partial remission: Secondary | ICD-10-CM | POA: Insufficient documentation

## 2021-10-03 DIAGNOSIS — R41840 Attention and concentration deficit: Secondary | ICD-10-CM | POA: Diagnosis not present

## 2021-10-03 DIAGNOSIS — F1010 Alcohol abuse, uncomplicated: Secondary | ICD-10-CM | POA: Diagnosis not present

## 2021-10-03 NOTE — Progress Notes (Unsigned)
[facility] MD OP Progress Note  10/03/2021 4:25 PM Terry Summers  MRN:  [id number]  Chief Complaint:  Chief Complaint  Patient presents with   Follow-up: 36 year old Caucasian male with history of depression, anxiety, alcoholism, presented for medication management.   HPI: Terry Summers is a 36 year old Caucasian male, employed, married, lives in [place], has a history of MDD, social anxiety, alcohol use disorder, vitamin D deficiency was evaluated in office today.  Patient today reports he has decided that he does not want to try the naltrexone or any other medication for his alcohol use.  He has been trying to do it by himself, has been cutting back.  He currently does not drink any alcohol on weekdays.  Patient reports on the weekends he has been drinking 6-8 beers per day.  Patient reports he is aware that still high however feels better that he has cut down a lot.  He is currently trying to reach out a psychotherapist in the area who can work with him on his alcohol use also.  He is interested in counseling.  Patient currently is not taking any psychotropic medications including the trazodone or the hydroxyzine.  Patient reports he is able to manage his mood symptoms and sleep without the help of these medications.  Patient denies any suicidality, homicidality or perceptual disturbances.  Patient denies any other concerns today.  Visit Diagnosis:    ICD-10-CM   1. MDD (major depressive disorder), recurrent, in full remission ([facility])  F33.42     2. Social anxiety disorder  F40.10     3. Alcohol use disorder, mild, abuse  F10.10     4. Attention and concentration deficit  R41.840       Past Psychiatric History: Reviewed past psychiatric history from progress note on 05/08/2021.  Past trials of sertraline-sexual side effects, nausea, Lexapro-side effects.  Past Medical History:  Past Medical History:  Diagnosis Date   Anxiety    Depression     Past Surgical History:  Procedure Laterality  Date   APPENDECTOMY  2011   LASIK Bilateral 2013   VASECTOMY  2019   WISDOM TOOTH EXTRACTION Bilateral 2006    Family Psychiatric History: Reviewed family psychiatric history from progress note on 05/08/2021.  Family History:  Family History  Problem Relation Age of Onset   Stroke Paternal Grandfather    Stroke Paternal Grandmother    ADD / ADHD Son    Prostate cancer Neg Hx    Kidney cancer Neg Hx    Bladder Cancer Neg Hx     Social History: Reviewed social history from progress note on 05/08/2021. Social History   Socioeconomic History   Marital status: Married    Spouse name: Kaveh Kissinger   Number of children: 2   Years of education: 14   Highest education level: Associate degree: academic program  Occupational History   Not on file  Tobacco Use   Smoking status: Never    Passive exposure: Never   Smokeless tobacco: Never  Vaping Use   Vaping Use: Never used  Substance and Sexual Activity   Alcohol use: Yes    Alcohol/week: 15.0 standard drinks of alcohol    Types: 15 Cans of beer per week   Drug use: Not Currently   Sexual activity: Yes    Partners: Female    Birth control/protection: Surgical  Other Topics Concern   Not on file  Social History Narrative   Not on file   Social Determinants of Health  Financial Resource Strain: Not on file  Food Insecurity: Not on file  Transportation Needs: Not on file  Physical Activity: Not on file  Stress: Not on file  Social Connections: Not on file    Allergies:  Allergies  Allergen Reactions   Penicillins Other (See Comments)    Pt states rash/unknown "I was a baby when it happened".     Metabolic Disorder Labs: No results found for: "HGBA1C", "MPG" No results found for: "PROLACTIN" Lab Results  Component Value Date   CHOL 235 (H) 03/21/2021   TRIG 129 03/21/2021   HDL 75 03/21/2021   CHOLHDL 3.1 03/21/2021   LDLCALC 138 (H) 03/21/2021   Lab Results  Component Value Date   TSH 3.200 03/21/2021     Therapeutic Level Labs: No results found for: "LITHIUM" No results found for: "VALPROATE" No results found for: "CBMZ"  Current Medications: Current Outpatient Medications  Medication Sig Dispense Refill   hydrOXYzine (VISTARIL) 25 MG capsule TAKE 1-2 CAPSULES (25-50 MG TOTAL) BY MOUTH AT BEDTIME AS NEEDED. FOR SLEEP 180 capsule 0   traZODone (DESYREL) 50 MG tablet Take 0.5-1 tablets (25-50 mg total) by mouth at bedtime as needed for sleep. 30 tablet 1   No current facility-administered medications for this visit.     Musculoskeletal: Strength & Muscle Tone: within normal limits Gait & Station: normal Patient leans: N/A  Psychiatric Specialty Exam: Review of Systems  Psychiatric/Behavioral:  The patient is nervous/anxious.   All other systems reviewed and are negative.   Blood pressure 131/88, pulse 69, height '5\' 10"'$  (1.778 m), weight 204 lb (92.5 kg).Body mass index is 29.27 kg/m.  General Appearance: Casual  Eye Contact:  Good  Speech:  Clear and Coherent  Volume:  Normal  Mood:  Anxious  Affect:  Full Range  Thought Process:  Goal Directed and Descriptions of Associations: Intact  Orientation:  Full (Time, Place, and Person)  Thought Content: Logical   Suicidal Thoughts:  No  Homicidal Thoughts:  No  Memory:  Immediate;   Fair Recent;   Fair Remote;   Fair  Judgement:  Fair  Insight:  Fair  Psychomotor Activity:  Normal  Concentration:  Concentration: Fair and Attention Span: Fair  Recall:  [employer] of Knowledge: Fair  Language: Fair  Akathisia:  No  Handed:  Right  AIMS (if indicated): not done  Assets:  Communication Skills Desire for Improvement Housing Social Support  ADL's:  Intact  Cognition: WNL  Sleep:  Fair   Screenings: [facility], [employer] [facility] Office Visit from 06/14/2021 in [facility] Office Visit from 05/08/2021 in [facility] Total Score 0 0      GAD-7    [place] Visit from 10/03/2021 in [place] Office Visit from 05/08/2021 in [facility] Office Visit from 04/17/2021 in [facility] [place] Visit from 03/06/2021 in [facility]  Total GAD-7 Score 0 '3 2 3      '$ PHQ2-9    [place] Visit from 10/03/2021 in [facility] Visit from 08/22/2021 in [facility] Visit from 06/14/2021 in [facility] Visit from 05/08/2021 in [place] Office Visit from 04/17/2021 in [place] [facility] Total Score '1 2 2 2 '$ 2  PHQ-9 Total Score -- '4 5 4 3      '$ [place] Office Visit from 10/03/2021 in [place] Office Visit from 08/22/2021 in [facility] Office Visit from 06/14/2021 in [facility] No Risk No Risk No Risk        Assessment and Plan: Bhavin Monjaraz is a 36 year old Caucasian male, employed, married, lives in [place], has a history of depression, anxiety, alcoholism was evaluated in office today.  Patient is currently improving with regards to his alcohol use, not interested in medication management for the same.  Patient is trying to get established with a psychotherapist.  Plan as noted below.  Plan MDD in remission Hydroxyzine 25-50 mg p.o. nightly as needed.  Although available he has not been using it. Trazodone 25-50 mg p.o. nightly as needed-although available reports he has not been using it.   Social anxiety disorder-improving Patient referred for CBT.  Not interested in medication management.  Alcohol use disorder-improving Patient to get establish with a therapist. Offered  naltrexone-patient is not interested.  Attention and concentration deficit-patient has upcoming appointment.  Follow-up in clinic in 6 months or sooner if needed.  In the meantime patient wants to establish care with a therapist.  Patient is currently not interested in medication management.   This note was generated in part or whole with voice recognition software. Voice recognition is usually quite accurate but there are transcription errors that can and very often do occur. I apologize for any typographical errors that were not detected and corrected.    Ursula Alert, MD 10/04/2021, 2:48 PM

## 2021-10-03 NOTE — Patient Instructions (Addendum)
[facility]   [address] [place], [place], [place] [address] [email or web address]  [phone]

## 2021-12-20 ENCOUNTER — Ambulatory Visit (INDEPENDENT_AMBULATORY_CARE_PROVIDER_SITE_OTHER): Payer: 59 | Admitting: Family Medicine

## 2021-12-20 ENCOUNTER — Encounter: Payer: Self-pay | Admitting: Family Medicine

## 2021-12-20 VITALS — BP 142/92 | HR 86 | Ht 70.0 in | Wt 202.0 lb

## 2021-12-20 DIAGNOSIS — J019 Acute sinusitis, unspecified: Secondary | ICD-10-CM

## 2021-12-20 DIAGNOSIS — B9689 Other specified bacterial agents as the cause of diseases classified elsewhere: Secondary | ICD-10-CM | POA: Insufficient documentation

## 2021-12-20 MED ORDER — PROMETHAZINE-DM 6.25-15 MG/5ML PO SYRP: 5.0000 mL | ORAL_SOLUTION | Freq: Four times a day (QID) | ORAL | 0 refills | Status: DC | PRN

## 2021-12-20 MED ORDER — METHYLPREDNISOLONE 4 MG PO TBPK: ORAL_TABLET | ORAL | 0 refills | Status: DC

## 2021-12-20 MED ORDER — AZITHROMYCIN 250 MG PO TABS: ORAL_TABLET | ORAL | 0 refills | Status: AC

## 2021-12-20 NOTE — Progress Notes (Signed)
     Primary Care / Sports Medicine Office Visit  Patient Information:  Patient ID: Terry Summers, male DOB: 27-Jun-1985 Age: 36 y.o. MRN: [id number]   Terry Summers is a pleasant 36 y.o. male presenting with the following:  Chief Complaint  Patient presents with   URI    Cough, congestion, itchy left eye. At home covid test was negative. For 9 days    Vitals:   12/20/21 1612 12/20/21 1613  BP: (!) 160/100 (!) 142/92  Pulse: 86   SpO2: 98%    Vitals:   12/20/21 1612  Weight: 202 lb (91.6 kg)  Height: '5\' 10"'$  (1.778 m)   Body mass index is 28.98 kg/m.  No results found.   Independent interpretation of notes and tests performed by another provider:   None  Procedures performed:   None  Pertinent History, Exam, Impression, and Recommendations:   Problem List Items Addressed This Visit       Respiratory   Acute bacterial rhinosinusitis - Primary    Patient with roughly 2 weeks of symptoms involving head pressure, congestion, dry hacking cough, severe night cough, no significant shortness of air, fevers, chills.  Did have mild improvement followed by progressive worsening.  He took a home COVID test which were negative.  Examination with stable baseline oxygen saturation, clear air entry throughout all fields, benign cardiac sounds, no significant lymphadenopathy, oropharynx with cobblestone pattern, no exudate, turbinates are swollen and erythematous, no overt sinus tenderness, tympanic membranes and canals benign.  Patient's clinical history and findings raise concern for secondary superimposed bacterial infection with focality to the sinuses, plan for azithromycin, Medrol course, as needed antitussive prescribed.  Supportive care advised, can follow-up as needed for this issue, otherwise return for annual physical.      Relevant Medications   azithromycin (ZITHROMAX) 250 MG tablet   methylPREDNISolone (MEDROL DOSEPAK) 4 MG TBPK tablet   promethazine-dextromethorphan  (PROMETHAZINE-DM) 6.25-15 MG/5ML syrup     Orders & Medications Meds ordered this encounter  Medications   azithromycin (ZITHROMAX) 250 MG tablet    Sig: Take 2 tablets on day 1, then 1 tablet daily on days 2 through 5    Dispense:  6 tablet    Refill:  0   methylPREDNISolone (MEDROL DOSEPAK) 4 MG TBPK tablet    Sig: Take for full course per package instructions    Dispense:  21 tablet    Refill:  0   promethazine-dextromethorphan (PROMETHAZINE-DM) 6.25-15 MG/5ML syrup    Sig: Take 5 mLs by mouth 4 (four) times daily as needed for cough.    Dispense:  118 mL    Refill:  0   No orders of the defined types were placed in this encounter.    Return for CPE.     Montel Culver, MD   Primary Care Sports Medicine [place]

## 2021-12-20 NOTE — Patient Instructions (Signed)
-   Start azithromycin course - Start steroid course - Use Rx cough medicine as needed - Stay hydrated, consider vitamin D and C supplementation - Contact us for any persistent symptoms after the above medication regimen - Return for annual physical February 2024

## 2021-12-20 NOTE — Assessment & Plan Note (Signed)
Patient with roughly 2 weeks of symptoms involving head pressure, congestion, dry hacking cough, severe night cough, no significant shortness of air, fevers, chills.  Did have mild improvement followed by progressive worsening.  He took a home COVID test which were negative.  Examination with stable baseline oxygen saturation, clear air entry throughout all fields, benign cardiac sounds, no significant lymphadenopathy, oropharynx with cobblestone pattern, no exudate, turbinates are swollen and erythematous, no overt sinus tenderness, tympanic membranes and canals benign.  Patient's clinical history and findings raise concern for secondary superimposed bacterial infection with focality to the sinuses, plan for azithromycin, Medrol course, as needed antitussive prescribed.  Supportive care advised, can follow-up as needed for this issue, otherwise return for annual physical.

## 2022-01-17 ENCOUNTER — Encounter: Payer: 59 | Attending: Psychology | Admitting: Psychology

## 2022-01-17 DIAGNOSIS — R41840 Attention and concentration deficit: Secondary | ICD-10-CM | POA: Insufficient documentation

## 2022-01-17 DIAGNOSIS — F4010 Social phobia, unspecified: Secondary | ICD-10-CM

## 2022-01-17 DIAGNOSIS — F320 Major depressive disorder, single episode, mild: Secondary | ICD-10-CM | POA: Insufficient documentation

## 2022-01-17 DIAGNOSIS — F324 Major depressive disorder, single episode, in partial remission: Secondary | ICD-10-CM

## 2022-01-31 DIAGNOSIS — F4010 Social phobia, unspecified: Secondary | ICD-10-CM

## 2022-01-31 DIAGNOSIS — R41840 Attention and concentration deficit: Secondary | ICD-10-CM

## 2022-01-31 DIAGNOSIS — F320 Major depressive disorder, single episode, mild: Secondary | ICD-10-CM

## 2022-01-31 NOTE — Progress Notes (Signed)
   Behavioral Observations The patient appeared well-groomed and appropriately dressed. His manners were polite and appropriate to the situation. The patient's attitude toward testing was positive and he showed good effort.   Neuropsychology Note  Terry Summers completed 90 minutes of neuropsychological testing with technician, Dina Rich, BA, under the supervision of Ilean Skill, PsyD., Clinical Neuropsychologist. The patient did not appear overtly distressed by the testing session, per behavioral observation or via self-report to the technician. Rest breaks were offered.   Clinical Decision Making: In considering the patient's current level of functioning, level of presumed impairment, nature of symptoms, emotional and behavioral responses during clinical interview, level of literacy, and observed level of motivation/effort, a battery of tests was selected by Dr. Sima Matas during initial consultation on 01/17/2022. This was communicated to the technician. Communication between the neuropsychologist and technician was ongoing throughout the testing session and changes were made as deemed necessary based on patient performance on testing, technician observations and additional pertinent factors such as those listed above.  Tests Administered: Comprehensive Attention Battery (CAB) Continuous Performance Test (CPT)  Results: Will be included in final report   Feedback to Patient: Terry Summers will return on 08/08/2022 or sooner for an interactive feedback session with Dr. Sima Matas at which time his test performances, clinical impressions and treatment recommendations will be reviewed in detail. The patient understands he can contact our office should he require our assistance before this time.  90 minutes spent face-to-face with patient administering standardized tests, 30 minutes spent scoring Environmental education officer). [CPT [id number], 83662]  Full report to follow.

## 2022-02-11 NOTE — Progress Notes (Signed)
Neuropsychological Consultation   Patient:   Terry Summers   DOB:   Sep 03, 1985  MR Number:  [id number]  Location:  [facility] [place], [place] [id number] [place] [address] Dept: [id number]           Date of Service:   01/17/2022  Start Time:   1 PM End Time:   3 PM  Today's visit was an in person visit dose conducted in outpatient clinic office with the patient myself present.  1 hour and 15 minutes was spent in face-to-face clinical interview and the other 45 minutes was spent with records review, report writing and setting up testing protocols.  Provider/Observer:  Ilean Skill, Psy.D.       Clinical Neuropsychologist       Billing Code/Service: 96116/96121  Reason for Service:    Terry Summers is a 37 year old male referred for neuropsychological evaluation by his treating psychiatrist Ursula Alert, MD to facilitate differential diagnosis with the possibility of an underlying adult residual attention deficit disorder with the patient who has a history of anxiety and depression with previous diagnosis of major depressive disorder, social anxiety, alcohol use.  Patient had been tried on Lexapro back in the summer but discontinued this medicine as he felt it was not helping his symptoms and had some side effects.  Patient did report after stopping Lexapro that he felt like his mood was improved and did not feel as depressed as he had prior.  Patient also reported anxiety was improving.  Patient does continue to report that he has had longstanding difficulties with attention and concentration deficits and continues to have struggles with alcohol use.  Patient reports that he has consistently consumed 5 beers per day Monday through Thursdays then on weekends he drinks 10 beers per day.  He has described craving alcohol previously.  Patient's wife is reported to be concerned about  his drinking.  During the clinical interview today, the patient reports that attentional issues have been present for well over 12 years and the patient has difficulty sitting still and is always on the go.  Patient reports that his memory is really bad and has difficulty remembering schedules and what needs to be scheduled.  Patient describes primarily episodic memory difficulties as well as difficulties "filtering" what he is saying to others.  Patient reports longstanding difficulties with social communication and effectively communicating to others.  Patient reports that he did not remember having any specific or real difficulties with attention during elementary school and would often get his work done very quickly so he can do other things that he liked more.  Patient describes current symptoms including hyperactivity, impulsivity, forgetfulness and depressive symptomatology.  Patient reports that his anxiety symptoms started later in life and then he developed depressive symptomatology which then combined to lead him to increasing alcohol use.  Patient reports that there is a strong family history of alcohol abuse and that his paternal grandfather died from excessive alcohol use.  The patient reports that he has never had a diagnosis of concussive event but reports that he did hit his head with great force on concrete once playing hockey and played soccer for years with lots of headers.  The patient denied ever experiencing events where he had lingering side effects or concussion.  The patient describes his sleep pattern to usually include 6 hours per night of sleep and feels like he sleeps pretty good.  Patient denies any history of significant hospitalizations or significant medical illness.  Behavioral Observation: Terry Summers  presents as a 37 y.o.-year-old Right handed Caucasian Male who appeared his stated age. his dress was Appropriate and he was Well Groomed and his manners were Appropriate to  the situation.  his participation was indicative of Appropriate behaviors.  There were not physical disabilities noted.  he displayed an appropriate level of cooperation and motivation.    Interactions:    Active Appropriate  Attention:   within normal limits and attention span and concentration were age appropriate  Memory:   within normal limits; recent and remote memory intact  Visuo-spatial:  not examined  Speech (Volume):  normal  Speech:   normal; normal  Thought Process:  Coherent and Relevant  Though Content:  WNL; not suicidal and not homicidal  Orientation:   person, place, time/date, and situation  Judgment:   Good  Planning:   Fair  Affect:    Anxious  Mood:    Anxious and Dysphoric  Insight:   Fair  Intelligence:   normal  Marital Status/Living: Patient was born and raised in [place] along with 1 sibling.  There were no significant childhood illnesses growing up and developmental milestones were reached at the appropriate time.  The patient is married and currently lives with his wife and 2 children aged 37 and 1.  He and his wife have been together for 17 years.  They have been married for 12 years.  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  He has been with [employer] for 13-1/2 years.  Interest in hobbies include sports and woodworking and building furniture.  Substance Use:  Patient acknowledges a history of alcohol use/abuse that continued to increase for some time after his anxiety and depressive symptomatology developed.  The patient has reported that he drinks approximately 5 beers a night during the week and as many as 10 beers a night on weekends.  Education:   The patient graduated from high school and completed his associates degree.  Medical History:   Past Medical History:  Diagnosis Date    Anxiety    Depression          Patient Active Problem List   Diagnosis Date Noted   Acute bacterial rhinosinusitis 12/20/2021   MDD (major depressive disorder), single episode, in partial remission ([place]) 10/03/2021   Alcohol use disorder, mild, abuse 08/22/2021   High risk medication use 08/22/2021   Social anxiety disorder 05/08/2021   Attention and concentration deficit 05/08/2021   Significant use of alcohol 05/08/2021   Seasonal allergies 03/06/2021   Annual physical exam 03/06/2021   Current mild episode of major depressive disorder without prior episode ([place]) 03/06/2021   Lipoma of back 03/06/2021    Psychiatric History:  Patient has a previous psychiatric diagnoses that include major depressive disorder as well as social anxiety disorder.  Family Med/Psych History:  Family History  Problem Relation Age of Onset   Stroke Paternal Grandfather    Stroke Paternal Grandmother    ADD / ADHD Son    Prostate cancer Neg Hx    Kidney cancer Neg Hx    Bladder Cancer Neg Hx     Impression/DX:  Shaarav Ripple is a 37 year old male referred for neuropsychological evaluation by his treating psychiatrist Ursula Alert, MD to  facilitate differential diagnosis with the possibility of an underlying adult residual attention deficit disorder with the patient who has a history of anxiety and depression with previous diagnosis of major depressive disorder, social anxiety, alcohol use.  Patient had been tried on Lexapro back in the summer but discontinued this medicine as he felt it was not helping his symptoms and had some side effects.  Patient did report after stopping Lexapro that he felt like his mood was improved and did not feel as depressed as he had prior.  Patient also reported anxiety was improving.  Patient does continue to report that he has had longstanding difficulties with attention and concentration deficits and continues to have struggles with alcohol use.  Patient reports that he  has consistently consumed 5 beers per day Monday through Thursdays then on weekends he drinks 10 beers per day.  He has described craving alcohol previously.  Patient's wife is reported to be concerned about his drinking.  Disposition/Plan:  We have set the patient up for formal neuropsychological testing and he will complete the comprehensive attention battery and the CAB CPT measures.  Once these are completed a determination will be made as to any potential need for further objective assessment.  Full report will be produced and forwarded to his referring psychiatrist as well as scheduling a formal face-to-face sitdown feedback directly to the patient to go over the results of the assessment with recommendations.  Diagnosis:    Attention and concentration deficit  MDD (major depressive disorder), single episode, in partial remission [facility])  Social anxiety disorder         Electronically Signed   _______________________ Ilean Skill, Psy.D. Clinical Neuropsychologist

## 2022-03-19 ENCOUNTER — Encounter: Payer: Self-pay | Admitting: Family Medicine

## 2022-03-19 ENCOUNTER — Ambulatory Visit (INDEPENDENT_AMBULATORY_CARE_PROVIDER_SITE_OTHER): Payer: 59 | Admitting: Family Medicine

## 2022-03-19 VITALS — BP 136/82 | HR 76 | Ht 70.0 in | Wt 196.0 lb

## 2022-03-19 DIAGNOSIS — B354 Tinea corporis: Secondary | ICD-10-CM

## 2022-03-19 DIAGNOSIS — E559 Vitamin D deficiency, unspecified: Secondary | ICD-10-CM

## 2022-03-19 DIAGNOSIS — Z0000 Encounter for general adult medical examination without abnormal findings: Secondary | ICD-10-CM

## 2022-03-19 DIAGNOSIS — Z13220 Encounter for screening for lipoid disorders: Secondary | ICD-10-CM | POA: Diagnosis not present

## 2022-03-19 DIAGNOSIS — F1010 Alcohol abuse, uncomplicated: Secondary | ICD-10-CM

## 2022-03-19 NOTE — Patient Instructions (Signed)
-   Obtain fasting labs with orders provided (can have water or black coffee but otherwise no food or drink x 8 hours before labs) °- Review information provided °- Attend eye doctor annually, dentist every 6 months, work towards or maintain 30 minutes of moderate intensity physical activity at least 5 days per week, and consume a balanced diet °- Return in 1 year for physical °- Contact us for any questions between now and then °

## 2022-03-19 NOTE — Progress Notes (Signed)
Annual Physical Exam Visit  Patient Information:  Patient ID: Terry Summers, male DOB: 07/20/1985 Age: 37 y.o. MRN: [id number]   Subjective:   CC: Annual Physical Exam  HPI:  Terry Summers is here for their annual physical.  I reviewed the past medical history, family history, social history, surgical history, and allergies today and changes were made as necessary.  Please see the problem list section below for additional details.  Past Medical History: Past Medical History:  Diagnosis Date   Anxiety    Depression    Past Surgical History: Past Surgical History:  Procedure Laterality Date   APPENDECTOMY  2011   LASIK Bilateral 2013   VASECTOMY  2019   WISDOM TOOTH EXTRACTION Bilateral 2006   Family History: Family History  Problem Relation Age of Onset   Stroke Paternal Grandfather    Stroke Paternal 26    ADD / ADHD Son    Prostate cancer Neg Hx    Kidney cancer Neg Hx    Bladder Cancer Neg Hx    Allergies: Allergies  Allergen Reactions   Penicillins Other (See Comments)    Pt states rash/unknown "I was a baby when it happened".    Health Maintenance: Health Maintenance  Topic Date Due   INFLUENZA VACCINE  05/12/2022 (Originally 09/11/2021)   COVID-19 Vaccine (1) 01/05/2026 (Originally 10/16/1989)   DTaP/Tdap/Td (2 - Td or Tdap) 05/04/2023   Hepatitis C Screening  Completed   HIV Screening  Completed   HPV VACCINES  Aged Out    HM Colonoscopy     This patient has no relevant Health Maintenance data.      Medications: No current outpatient medications on file prior to visit.   No current facility-administered medications on file prior to visit.    Review of Systems: No headache, visual changes, nausea, vomiting, diarrhea, constipation, dizziness, abdominal pain, +skin rash, fevers, chills, night sweats, swollen lymph nodes, weight loss, chest pain, body aches, joint swelling, muscle aches, shortness of breath, mood changes, visual or auditory  hallucinations reported.  Objective:   Vitals:   03/19/22 1549  BP: 136/82  Pulse: 76  SpO2: 97%   Vitals:   03/19/22 1549  Weight: 196 lb (88.9 kg)  Height: 5' 10"$  (1.778 m)   Body mass index is 28.12 kg/m.  General: Well Developed, well nourished, and in no acute distress.  Neuro: Alert and oriented x3, extra-ocular muscles intact, sensation grossly intact. Cranial nerves II through XII are grossly intact, motor, sensory, and coordinative functions are intact. HEENT: Normocephalic, atraumatic, pupils equal round reactive to light, neck supple, no masses, no lymphadenopathy, thyroid nonpalpable. Oropharynx, nasopharynx, external ear canals are unremarkable. Skin: Warm and dry, left forearm annular rash. Cardiac: Regular rate and rhythm, no murmurs rubs or gallops. No peripheral edema. Pulses symmetric. Respiratory: Clear to auscultation bilaterally. Not using accessory muscles, speaking in full sentences.  Abdominal: Soft, nontender, nondistended, positive bowel sounds, no masses, no organomegaly. Musculoskeletal: Shoulder, elbow, wrist, hip, knee, ankle stable, and with full range of motion.   Impression and Recommendations:   The patient was counselled, risk factors were discussed, and anticipatory guidance given.  Problem List Items Addressed This Visit       Musculoskeletal and Integument   Tinea corporis    Incidentally noted on exam, treatments outlined, differential to include tinea corporis, topical regimen prescribed.      Relevant Medications   clotrimazole-betamethasone (LOTRISONE) cream     Other   Annual physical exam -  Primary    Annual examination completed, risk stratification labs ordered, anticipatory guidance provided.  We will follow labs once resulted.      Relevant Orders   CBC   Comprehensive metabolic panel   Lipid panel   TSH   VITAMIN D 25 Hydroxy (Vit-D Deficiency, Fractures)   Alcohol use disorder, mild, abuse    Has established with  psychiatry, excellent interval improvement and control.      Other Visit Diagnoses     Screening for lipoid disorders       Relevant Orders   Comprehensive metabolic panel   Lipid panel   Vitamin D deficiency       Relevant Orders   VITAMIN D 25 Hydroxy (Vit-D Deficiency, Fractures)        Orders & Medications Medications:  Meds ordered this encounter  Medications   clotrimazole-betamethasone (LOTRISONE) cream    Sig: Apply 1 Application topically 2 (two) times daily.    Dispense:  45 g    Refill:  0   Orders Placed This Encounter  Procedures   CBC   Comprehensive metabolic panel   Lipid panel   TSH   VITAMIN D 25 Hydroxy (Vit-D Deficiency, Fractures)     Return in about 1 year (around 03/20/2023) for CPE.    Montel Culver, MD, [facility]   Primary Care Sports Medicine Primary Care and Sports Medicine at [facility]

## 2022-03-20 ENCOUNTER — Encounter: Payer: Self-pay | Admitting: Psychiatry

## 2022-03-20 ENCOUNTER — Ambulatory Visit (INDEPENDENT_AMBULATORY_CARE_PROVIDER_SITE_OTHER): Payer: 59 | Admitting: Psychiatry

## 2022-03-20 VITALS — BP 135/83 | HR 62 | Temp 97.8°F | Ht 70.0 in | Wt 196.4 lb

## 2022-03-20 DIAGNOSIS — F1010 Alcohol abuse, uncomplicated: Secondary | ICD-10-CM | POA: Diagnosis not present

## 2022-03-20 DIAGNOSIS — F4010 Social phobia, unspecified: Secondary | ICD-10-CM | POA: Diagnosis not present

## 2022-03-20 DIAGNOSIS — Z79899 Other long term (current) drug therapy: Secondary | ICD-10-CM

## 2022-03-20 DIAGNOSIS — F19980 Other psychoactive substance use, unspecified with psychoactive substance-induced anxiety disorder: Secondary | ICD-10-CM | POA: Diagnosis not present

## 2022-03-20 DIAGNOSIS — R41840 Attention and concentration deficit: Secondary | ICD-10-CM

## 2022-03-20 DIAGNOSIS — F3342 Major depressive disorder, recurrent, in full remission: Secondary | ICD-10-CM

## 2022-03-20 DIAGNOSIS — F418 Other specified anxiety disorders: Secondary | ICD-10-CM | POA: Insufficient documentation

## 2022-03-20 MED ORDER — GABAPENTIN 100 MG PO CAPS: 100.0000 mg | ORAL_CAPSULE | Freq: Two times a day (BID) | ORAL | 1 refills | Status: DC

## 2022-03-20 NOTE — Progress Notes (Unsigned)
[facility] MD OP Progress Note  03/20/2022 4:40 PM Terry Summers  MRN:  [id number]  Chief Complaint:  Chief Complaint  Patient presents with   Follow-up   Anxiety   Alcohol Problem   HPI: Terry Summers is a 37 year old Caucasian male, employed, married, lives in [place], has a history of MDD, social anxiety, alcohol use disorder, vitamin D deficiency was evaluated in office today.  Patient today reports he is currently trying to cut back on his alcohol use.  Patient reports he has cut back to just drinking 6 beers on weekends only now.  He reports he was drinking way too much before that.  Patient reports he is trying his best to stop using alcohol.  He however reports since cutting back on the alcohol use he has been having a lot of anxiety symptoms, he reports feeling nervous, anxious on edge as well as worrying about things in general.  Patient reports he also has been struggling with sleep, has difficulty falling asleep since cutting back on the alcohol.  Patient reports he had neuropsychological testing done with Dr. Sima Matas and has continued follow-up scheduled with them.  Patient reports he is currently not taking any medications for his anxiety or sleep.  He does have trazodone available however has not started using it yet.  Patient reports he is interested in medications for his anxiety.  He is not sure whether he should start something for alcohol craving, however is currently working with a therapist, Benita Gutter on the same.  Patient denies any suicidality, homicidality or perceptual disturbances.  Patient denies any other concerns today.  Visit Diagnosis:    ICD-10-CM   1. MDD (major depressive disorder), recurrent, in full remission ([place])  F33.42 Urine drugs of abuse scrn w alc, routine (Ref Lab)    gabapentin (NEURONTIN) 100 MG capsule    2. Social anxiety disorder  F40.10 Urine drugs of abuse scrn w alc, routine (Ref Lab)    gabapentin (NEURONTIN) 100 MG capsule    3.  Substance-induced anxiety disorder (H[facility])  F19.980 Urine drugs of abuse scrn w alc, routine (Ref Lab)    gabapentin (NEURONTIN) 100 MG capsule   alcohol    4. Alcohol use disorder, mild, abuse  F10.10 Urine drugs of abuse scrn w alc, routine (Ref Lab)    gabapentin (NEURONTIN) 100 MG capsule    5. High risk medication use  Z79.899 Urine drugs of abuse scrn w alc, routine (Ref Lab)    6. Attention and concentration deficit  R41.840       Past Psychiatric History: Reviewed past psychiatric history from progress note on 05/08/2021.  Past trials of sertraline, sexual side effects, nausea, Lexapro-side effects.  Past Medical History:  Past Medical History:  Diagnosis Date   Anxiety    Depression     Past Surgical History:  Procedure Laterality Date   APPENDECTOMY  2011   LASIK Bilateral 2013   VASECTOMY  2019   WISDOM TOOTH EXTRACTION Bilateral 2006    Family Psychiatric History: Reviewed family psychiatric history from progress note on 05/08/2021.  Family History:  Family History  Problem Relation Age of Onset   Stroke Paternal Grandfather    Stroke Paternal Grandmother    ADD / ADHD Son    Prostate cancer Neg Hx    Kidney cancer Neg Hx    Bladder Cancer Neg Hx     Social History: Reviewed social history from progress note on 05/08/2021. Social History   Socioeconomic History  Marital status: Married    Spouse name: Mohd Clemons   Number of children: 2   Years of education: 14   Highest education level: Associate degree: academic program  Occupational History   Not on file  Tobacco Use   Smoking status: Never    Passive exposure: Never   Smokeless tobacco: Never  Vaping Use   Vaping Use: Never used  Substance and Sexual Activity   Alcohol use: Yes    Alcohol/week: 15.0 standard drinks of alcohol    Types: 15 Cans of beer per week   Drug use: Not Currently   Sexual activity: Yes    Partners: Female    Birth control/protection: Surgical  Other Topics  Concern   Not on file  Social History Narrative   Not on file   Social Determinants of Health   Financial Resource Strain: Not on file  Food Insecurity: Not on file  Transportation Needs: Not on file  Physical Activity: Not on file  Stress: Not on file  Social Connections: Not on file    Allergies:  Allergies  Allergen Reactions   Penicillins Other (See Comments)    Pt states rash/unknown "I was a baby when it happened".     Metabolic Disorder Labs: No results found for: "HGBA1C", "MPG" No results found for: "PROLACTIN" Lab Results  Component Value Date   CHOL 235 (H) 03/21/2021   TRIG 129 03/21/2021   HDL 75 03/21/2021   CHOLHDL 3.1 03/21/2021   LDLCALC 138 (H) 03/21/2021   Lab Results  Component Value Date   TSH 3.200 03/21/2021    Therapeutic Level Labs: No results found for: "LITHIUM" No results found for: "VALPROATE" No results found for: "CBMZ"  Current Medications: Current Outpatient Medications  Medication Sig Dispense Refill   gabapentin (NEURONTIN) 100 MG capsule Take 1 capsule (100 mg total) by mouth 2 (two) times daily. 60 capsule 1   No current facility-administered medications for this visit.     Musculoskeletal: Strength & Muscle Tone: within normal limits Gait & Station: normal Patient leans: N/A  Psychiatric Specialty Exam: Review of Systems  Psychiatric/Behavioral:  Positive for decreased concentration and sleep disturbance. The patient is nervous/anxious.   All other systems reviewed and are negative.   Blood pressure 135/83, pulse 62, temperature 97.8 F (36.6 C), temperature source Oral, height '5\' 10"'$  (1.778 m), weight 196 lb 6.4 oz (89.1 kg).Body mass index is 28.18 kg/m.  General Appearance: Casual  Eye Contact:  Fair  Speech:  Clear and Coherent  Volume:  Normal  Mood:  Anxious  Affect:  Congruent  Thought Process:  Goal Directed and Descriptions of Associations: Intact  Orientation:  Full (Time, Place, and Person)   Thought Content: Logical   Suicidal Thoughts:  No  Homicidal Thoughts:  No  Memory:  Immediate;   Fair Recent;   Fair Remote;   Fair  Judgement:  Fair  Insight:  Fair  Psychomotor Activity:  Normal  Concentration:  Concentration: Fair and Attention Span: Fair  Recall:  [employer] of Knowledge: Fair  Language: Fair  Akathisia:  No  Handed:  Right  AIMS (if indicated): not done  Assets:  Communication Skills Desire for Improvement Housing Social Support  ADL's:  Intact  Cognition: WNL  Sleep:  Poor   Screenings: Administrator, Civil Service Office Visit from 06/14/2021 in [facility] Office Visit from 05/08/2021 in [facility] Total Score 0 0  [place] Office Visit from 03/20/2022 in [place] Office Visit from 03/19/2022 in [facility] [place] Visit from 10/03/2021 in [facility] Office Visit from 05/08/2021 in [facility] Office Visit from 04/17/2021 in [facility] [place]  Total GAD-7 Score 6 10 0 3 2      PHQ2-9    [place] Office Visit from 03/20/2022 in [facility] Office Visit from 03/19/2022 in [facility] [place] Visit from 10/03/2021 in [facility] Office Visit from 08/22/2021 in [facility] Office Visit from 06/14/2021 in [facility]  PHQ-2 Total Score 0 0 '1 2 2  '$ PHQ-9 Total Score 3 6 -- 4 5      [place] Office Visit from 03/20/2022 in [facility] Office Visit from 10/03/2021 in [facility] Office Visit from 08/22/2021 in [facility] No Risk No Risk No Risk        Assessment and Plan: Manvir Prabhu is a 37 year old Caucasian male, employed, married, lives in [place], has a history of depression, anxiety, alcoholism was evaluated in office today.  Patient is currently trying to cut back on his alcohol use and likely struggling with withdrawal symptoms including anxiety, headaches, sleep problems, will benefit from the following plan.  Plan MDD in remission Patient to continue CBT  Social anxiety disorder-improving Continue CBT with Ms. Benita Gutter  Alcohol use disorder mild-unstable Patient is currently trying to cut back.  Discussed referral to AA meetings-patient to reach out. Will consider starting naltrexone in the future. Continue CBT with Ms. Benita Gutter  Alcohol-induced anxiety-rule out withdrawal-unstable Will start gabapentin 100 mg p.o. twice daily for anxiety. Patient to continue to cut back on alcohol use.  Attention and concentration deficit-patient completed neuropsychological testing, pending records.  Patient has upcoming visit.  Follow-up in clinic in 2 weeks or sooner if needed.    This note was generated in part or whole with voice recognition software. Voice recognition is usually quite accurate but there are transcription errors that can and very often do occur. I apologize for any typographical errors that were not detected and corrected.    Ursula Alert, MD 03/21/2022, 11:46 AM

## 2022-03-20 NOTE — Patient Instructions (Signed)
Gabapentin Capsules or Tablets What is this medication? GABAPENTIN (GA ba pen tin) treats nerve pain. It may also be used to prevent and control seizures in people with epilepsy. It works by calming overactive nerves in your body. This medicine may be used for other purposes; ask your health care provider or pharmacist if you have questions. COMMON BRAND NAME(S): Active-PAC with Gabapentin, Gabarone, Gralise, Neurontin What should I tell my care team before I take this medication? They need to know if you have any of these conditions: Alcohol or substance use disorder Kidney disease Lung or breathing disease Suicidal thoughts, plans, or attempt; a previous suicide attempt by you or a family member An unusual or allergic reaction to gabapentin, other medications, foods, dyes, or preservatives Pregnant or trying to get pregnant Breast-feeding How should I use this medication? Take this medication by mouth with a glass of water. Follow the directions on the prescription label. You can take it with or without food. If it upsets your stomach, take it with food. Take your medication at regular intervals. Do not take it more often than directed. Do not stop taking except on your care team's advice. If you are directed to break the 600 or 800 mg tablets in half as part of your dose, the extra half tablet should be used for the next dose. If you have not used the extra half tablet within 28 days, it should be thrown away. A special MedGuide will be given to you by the pharmacist with each prescription and refill. Be sure to read this information carefully each time. Talk to your care team about the use of this medication in children. While this medication may be prescribed for children as young as 3 years for selected conditions, precautions do apply. Overdosage: If you think you have taken too much of this medicine contact a poison control center or emergency room at once. NOTE: This medicine is only for  you. Do not share this medicine with others. What if I miss a dose? If you miss a dose, take it as soon as you can. If it is almost time for your next dose, take only that dose. Do not take double or extra doses. What may interact with this medication? Alcohol Antihistamines for allergy, cough, and cold Certain medications for anxiety or sleep Certain medications for depression like amitriptyline, fluoxetine, sertraline Certain medications for seizures like phenobarbital, primidone Certain medications for stomach problems General anesthetics like halothane, isoflurane, methoxyflurane, propofol Local anesthetics like lidocaine, pramoxine, tetracaine Medications that relax muscles for surgery Opioid medications for pain Phenothiazines like chlorpromazine, mesoridazine, prochlorperazine, thioridazine This list may not describe all possible interactions. Give your health care provider a list of all the medicines, herbs, non-prescription drugs, or dietary supplements you use. Also tell them if you smoke, drink alcohol, or use illegal drugs. Some items may interact with your medicine. What should I watch for while using this medication? Visit your care team for regular checks on your progress. You may want to keep a record at home of how you feel your condition is responding to treatment. You may want to share this information with your care team at each visit. You should contact your care team if your seizures get worse or if you have any new types of seizures. Do not stop taking this medication or any of your seizure medications unless instructed by your care team. Stopping your medication suddenly can increase your seizures or their severity. This medication may cause serious skin   reactions. They can happen weeks to months after starting the medication. Contact your care team right away if you notice fevers or flu-like symptoms with a rash. The rash may be red or purple and then turn into blisters or  peeling of the skin. Or, you might notice a red rash with swelling of the face, lips or lymph nodes in your neck or under your arms. Wear a medical identification bracelet or chain if you are taking this medication for seizures. Carry a card that lists all your medications. This medication may affect your coordination, reaction time, or judgment. Do not drive or operate machinery until you know how this medication affects you. Sit up or stand slowly to reduce the risk of dizzy or fainting spells. Drinking alcohol with this medication can increase the risk of these side effects. Your mouth may get dry. Chewing sugarless gum or sucking hard candy, and drinking plenty of water may help. Watch for new or worsening thoughts of suicide or depression. This includes sudden changes in mood, behaviors, or thoughts. These changes can happen at any time but are more common in the beginning of treatment or after a change in dose. Call your care team right away if you experience these thoughts or worsening depression. If you become pregnant while using this medication, you may enroll in the [place] American Antiepileptic Drug Pregnancy Registry by calling 1-888-233-2334. This registry collects information about the safety of antiepileptic medication use during pregnancy. What side effects may I notice from receiving this medication? Side effects that you should report to your care team as soon as possible: Allergic reactions or angioedema--skin rash, itching, hives, swelling of the face, eyes, lips, tongue, arms, or legs, trouble swallowing or breathing Rash, fever, and swollen lymph nodes Thoughts of suicide or self harm, worsening mood, feelings of depression Trouble breathing Unusual changes in mood or behavior in children after use such as difficulty concentrating, hostility, or restlessness Side effects that usually do not require medical attention (report to your care team if they continue or are  bothersome): Dizziness Drowsiness Nausea Swelling of ankles, feet, or hands Vomiting This list may not describe all possible side effects. Call your doctor for medical advice about side effects. You may report side effects to FDA at 1-800-FDA-1088. Where should I keep my medication? Keep out of reach of children and pets. Store at room temperature between 15 and 30 degrees C (59 and 86 degrees F). Get rid of any unused medication after the expiration date. This medication may cause accidental overdose and death if taken by other adults, children, or pets. To get rid of medications that are no longer needed or have expired: Take the medication to a medication take-back program. Check with your pharmacy or law enforcement to find a location. If you cannot return the medication, check the label or package insert to see if the medication should be thrown out in the garbage or flushed down the toilet. If you are not sure, ask your care team. If it is safe to put it in the trash, empty the medication out of the container. Mix the medication with cat litter, dirt, coffee grounds, or other unwanted substance. Seal the mixture in a bag or container. Put it in the trash. NOTE: This sheet is a summary. It may not cover all possible information. If you have questions about this medicine, talk to your doctor, pharmacist, or health care provider.  2023 Elsevier/Gold Standard (2020-07-31 00:00:00)  

## 2022-03-26 ENCOUNTER — Encounter: Payer: Self-pay | Admitting: Family Medicine

## 2022-03-26 DIAGNOSIS — B354 Tinea corporis: Secondary | ICD-10-CM | POA: Insufficient documentation

## 2022-03-26 MED ORDER — CLOTRIMAZOLE-BETAMETHASONE 1-0.05 % EX CREA: 1.0000 | TOPICAL_CREAM | Freq: Two times a day (BID) | CUTANEOUS | 0 refills | Status: DC

## 2022-03-26 NOTE — Assessment & Plan Note (Signed)
Annual examination completed, risk stratification labs ordered, anticipatory guidance provided.  We will follow labs once resulted. 

## 2022-03-26 NOTE — Assessment & Plan Note (Signed)
Has established with psychiatry, excellent interval improvement and control.

## 2022-03-26 NOTE — Assessment & Plan Note (Signed)
Incidentally noted on exam, treatments outlined, differential to include tinea corporis, topical regimen prescribed.

## 2022-03-27 LAB — CBC
Hematocrit: 44.7 % (ref 37.5–51.0)
Hemoglobin: 15.1 g/dL (ref 13.0–17.7)
MCH: 28.9 pg (ref 26.6–33.0)
MCHC: 33.8 g/dL (ref 31.5–35.7)
MCV: 86 fL (ref 79–97)
Platelets: 275 10*3/uL (ref 150–450)
RBC: 5.22 x10E6/uL (ref 4.14–5.80)
RDW: 12.5 % (ref 11.6–15.4)
WBC: 5.3 10*3/uL (ref 3.4–10.8)

## 2022-03-27 LAB — COMPREHENSIVE METABOLIC PANEL
ALT: 20 IU/L (ref 0–44)
AST: 21 IU/L (ref 0–40)
Albumin/Globulin Ratio: 1.9 (ref 1.2–2.2)
Albumin: 4.6 g/dL (ref 4.1–5.1)
Alkaline Phosphatase: 70 IU/L (ref 44–121)
BUN/Creatinine Ratio: 16 (ref 9–20)
BUN: 16 mg/dL (ref 6–20)
Bilirubin Total: 0.2 mg/dL (ref 0.0–1.2)
CO2: 22 mmol/L (ref 20–29)
Calcium: 9.5 mg/dL (ref 8.7–10.2)
Chloride: 101 mmol/L (ref 96–106)
Creatinine, Ser: 1.02 mg/dL (ref 0.76–1.27)
Globulin, Total: 2.4 g/dL (ref 1.5–4.5)
Glucose: 105 mg/dL — ABNORMAL HIGH (ref 70–99)
Potassium: 4.7 mmol/L (ref 3.5–5.2)
Sodium: 137 mmol/L (ref 134–144)
Total Protein: 7 g/dL (ref 6.0–8.5)
eGFR: 97 mL/min/{1.73_m2} (ref >59)

## 2022-03-27 LAB — LIPID PANEL
Chol/HDL Ratio: 3.3 ratio (ref 0.0–5.0)
Cholesterol, Total: 173 mg/dL (ref 100–199)
HDL: 53 mg/dL (ref >39)
LDL Chol Calc (NIH): 88 mg/dL (ref 0–99)
Triglycerides: 186 mg/dL — ABNORMAL HIGH (ref 0–149)
VLDL Cholesterol Cal: 32 mg/dL (ref 5–40)

## 2022-03-27 LAB — VITAMIN D 25 HYDROXY (VIT D DEFICIENCY, FRACTURES): Vit D, 25-Hydroxy: 25.6 ng/mL — ABNORMAL LOW (ref 30.0–100.0)

## 2022-03-27 LAB — TSH: TSH: 2.83 u[IU]/mL (ref 0.450–4.500)

## 2022-03-28 ENCOUNTER — Other Ambulatory Visit: Payer: Self-pay | Admitting: Family Medicine

## 2022-03-28 DIAGNOSIS — E559 Vitamin D deficiency, unspecified: Secondary | ICD-10-CM

## 2022-03-28 MED ORDER — VITAMIN D (ERGOCALCIFEROL) 1.25 MG (50000 UNIT) PO CAPS: 50000.0000 [IU] | ORAL_CAPSULE | ORAL | 0 refills | Status: DC

## 2022-04-01 ENCOUNTER — Telehealth: Payer: Self-pay | Admitting: Psychiatry

## 2022-04-01 NOTE — Telephone Encounter (Signed)
I have reviewed urine drug screen-dated 03/26/2022-negative for amphetamines, barbiturates, benzodiazepine, cannabinoids, cocaine metabolites, opiates ( including codeine and morphine only), phencyclidine, methadone, propoxyphene, ethanol-all negative.

## 2022-04-04 ENCOUNTER — Ambulatory Visit (INDEPENDENT_AMBULATORY_CARE_PROVIDER_SITE_OTHER): Payer: 59 | Admitting: Psychiatry

## 2022-04-04 ENCOUNTER — Encounter: Payer: Self-pay | Admitting: Psychiatry

## 2022-04-04 VITALS — BP 122/84 | HR 71 | Temp 98.7°F | Ht 70.0 in | Wt 195.8 lb

## 2022-04-04 DIAGNOSIS — Z79899 Other long term (current) drug therapy: Secondary | ICD-10-CM

## 2022-04-04 DIAGNOSIS — F1010 Alcohol abuse, uncomplicated: Secondary | ICD-10-CM | POA: Diagnosis not present

## 2022-04-04 DIAGNOSIS — F3342 Major depressive disorder, recurrent, in full remission: Secondary | ICD-10-CM

## 2022-04-04 DIAGNOSIS — R41840 Attention and concentration deficit: Secondary | ICD-10-CM

## 2022-04-04 DIAGNOSIS — F4010 Social phobia, unspecified: Secondary | ICD-10-CM

## 2022-04-04 DIAGNOSIS — F19980 Other psychoactive substance use, unspecified with psychoactive substance-induced anxiety disorder: Secondary | ICD-10-CM | POA: Diagnosis not present

## 2022-04-04 NOTE — Progress Notes (Signed)
Terry Summers OP Progress Note  04/04/2022 6:07 PM Terry Summers  MRN:  [id number]  Chief Complaint:  Chief Complaint  Patient presents with   Follow-up   Depression   Anxiety   Drug / Alcohol Assessment   HPI: Terry Summers is a 37 year old Caucasian male, employed, married, lives in [place], has a history of MDD, social anxiety disorder, alcohol use disorder, vitamin D deficiency was evaluated in office today.  Patient today reports he continues to try to cut back on his alcohol use.  The last time he had a drink was on Friday.  Patient reports he had 6 beers that day.  He has not had a drink since then.  He does have craving.  Patient however not sure if he is ready to start the naltrexone or not.  Would like to research more.  Currently taking the gabapentin however taking a lower dosage, takes a 100 mg daily in the evening.  Reports that is beneficial.  Denies any side effects.  Continues to have attention and focus problems, had neuropsychological testing completed by Dr. Sima Matas.  Has upcoming visit, looks forward to that.  Pending records.  Patient continues to follow-up with therapist Ms. Benita Gutter every 3 weeks or so.  That has been beneficial.  Denies any suicidality, homicidality or perceptual disturbances.  Patient denies any other concerns today.   Visit Diagnosis:    ICD-10-CM   1. MDD (major depressive disorder), recurrent, in full remission ([place])  F33.42     2. Social anxiety disorder  F40.10     3. Substance-induced anxiety disorder (HCC)  F19.980    alcohol    4. Alcohol use disorder, mild, abuse  F10.10     5. Attention and concentration deficit  R41.840     6. High risk medication use  Z79.899       Past Psychiatric History: Reviewed past psychiatric history from progress note on 05/08/2021.  Past trials of sertraline - sexual side effects, nausea, Lexapro-side effects.  Past Medical History:  Past Medical History:  Diagnosis Date   Anxiety    Depression      Past Surgical History:  Procedure Laterality Date   APPENDECTOMY  2011   LASIK Bilateral 2013   VASECTOMY  2019   WISDOM TOOTH EXTRACTION Bilateral 2006    Family Psychiatric History: Reviewed family psychiatric history from progress note on 05/08/2021.  Family History:  Family History  Problem Relation Age of Onset   Stroke Paternal Grandfather    Stroke Paternal Grandmother    ADD / ADHD Son    Prostate cancer Neg Hx    Kidney cancer Neg Hx    Bladder Cancer Neg Hx     Social History: Reviewed social history from progress note on 05/08/2021. Social History   Socioeconomic History   Marital status: Married    Spouse name: Kaleil Hansell   Number of children: 2   Years of education: 14   Highest education level: Associate degree: academic program  Occupational History   Not on file  Tobacco Use   Smoking status: Never    Passive exposure: Never   Smokeless tobacco: Never  Vaping Use   Vaping Use: Never used  Substance and Sexual Activity   Alcohol use: Yes    Alcohol/week: 15.0 standard drinks of alcohol    Types: 15 Cans of beer per week   Drug use: Not Currently   Sexual activity: Yes    Partners: Female    Birth control/protection:  Surgical  Other Topics Concern   Not on file  Social History Narrative   Not on file   Social Determinants of Health   Financial Resource Strain: Not on file  Food Insecurity: Not on file  Transportation Needs: Not on file  Physical Activity: Not on file  Stress: Not on file  Social Connections: Not on file    Allergies:  Allergies  Allergen Reactions   Penicillins Other (See Comments)    Pt states rash/unknown "I was a baby when it happened".     Metabolic Disorder Labs: No results found for: "HGBA1C", "MPG" No results found for: "PROLACTIN" Lab Results  Component Value Date   CHOL 173 03/26/2022   TRIG 186 (H) 03/26/2022   HDL 53 03/26/2022   CHOLHDL 3.3 03/26/2022   LDLCALC 88 03/26/2022   LDLCALC 138  (H) 03/21/2021   Lab Results  Component Value Date   TSH 2.830 03/26/2022   TSH 3.200 03/21/2021    Therapeutic Level Labs: No results found for: "LITHIUM" No results found for: "VALPROATE" No results found for: "CBMZ"  Current Medications: Current Outpatient Medications  Medication Sig Dispense Refill   gabapentin (NEURONTIN) 100 MG capsule Take 1 capsule (100 mg total) by mouth 2 (two) times daily. 60 capsule 1   Vitamin D, Ergocalciferol, (DRISDOL) 1.25 MG (50000 UNIT) CAPS capsule Take 1 capsule (50,000 Units total) by mouth every 7 (seven) days. Take for 8 total doses(weeks) 8 capsule 0   clotrimazole-betamethasone (LOTRISONE) cream Apply 1 Application topically 2 (two) times daily. (Patient not taking: Reported on 04/04/2022) 45 g 0   No current facility-administered medications for this visit.     Musculoskeletal: Strength & Muscle Tone: within normal limits Gait & Station: normal Patient leans: N/A  Psychiatric Specialty Exam: Review of Systems  Psychiatric/Behavioral:  Positive for decreased concentration. The patient is nervous/anxious.   All other systems reviewed and are negative.   Blood pressure 122/84, pulse 71, temperature 98.7 F (37.1 C), temperature source Skin, height '5\' 10"'$  (1.778 m), weight 195 lb 12.8 oz (88.8 kg).Body mass index is 28.09 kg/m.  General Appearance: Casual  Eye Contact:  Fair  Speech:  Clear and Coherent  Volume:  Normal  Mood:  Anxious improving  Affect:  Congruent  Thought Process:  Goal Directed and Descriptions of Associations: Intact  Orientation:  Full (Time, Place, and Person)  Thought Content: Logical   Suicidal Thoughts:  No  Homicidal Thoughts:  No  Memory:  Immediate;   Fair Recent;   Fair Remote;   Fair  Judgement:  Fair  Insight:  Fair  Psychomotor Activity:  Normal  Concentration:  Concentration: Fair and Attention Span: Fair  Recall:  [employer] of Knowledge: Fair  Language: Fair  Akathisia:  No  Handed:   Right  AIMS (if indicated): not done  Assets:  Communication Skills Desire for Improvement Housing Social Support  ADL's:  Intact  Cognition: WNL  Sleep:  Fair   Screenings: Administrator, Civil Service Office Visit from 06/14/2021 in [facility] Office Visit from 05/08/2021 in [facility] Total Score 0 0      AUDIT    [place] Office Visit from 04/04/2022 in [facility]  Alcohol Use Disorder Identification Test Final Score (AUDIT) 14      GAD-7    [place] Office Visit from 04/04/2022 in [facility]  Visit from 03/20/2022 in [facility] Office Visit from 03/19/2022 in [facility] [place] Visit from 10/03/2021 in [facility] Office Visit from 05/08/2021 in [facility]  Total GAD-7 Score '4 6 10 '$ 0 3      PHQ2-9    [place] Office Visit from 04/04/2022 in [facility] Office Visit from 03/20/2022 in [facility] Office Visit from 03/19/2022 in [facility] [place] Visit from 10/03/2021 in [facility] Office Visit from 08/22/2021 in [facility]  PHQ-2 Total Score 1 0 0 1 2  PHQ-9 Total Score '3 3 6 '$ -- 4      [place] Office Visit from 04/04/2022 in [facility] Office Visit from 03/20/2022 in [facility] Office Visit from 10/03/2021 in [facility] No Risk No Risk No Risk        Assessment and  Plan: Livan Kubacki is a 37 year old Caucasian male, employed, married, lives in [place], has a history of depression, anxiety, alcoholism was evaluated in office today.  Patient is currently improving although continues to have alcoholism-binging episodes although currently cutting back.  Plan as noted below.  Plan MDD in remission Continue CBT  Social anxiety disorder-improving Continue CBT with Ms. Benita Gutter  Alcohol use disorder mild-improving Continue gabapentin 100 mg daily, prescribed as 100 mg twice daily.  Patient could increase it to twice daily . Discussed naltrexone-patient provided information.  Will consider starting it. Continue CBT.  Substance induced anxiety-alcohol-related-improving Continue gabapentin as prescribed Patient is on a lower dosage as noted above.  Attention and concentration deficit-pending neuropsychological testing report.  Follow-up in clinic in 6 to 8 weeks or sooner if needed.    This note was generated in part or whole with voice recognition software. Voice recognition is usually quite accurate but there are transcription errors that can and very often do occur. I apologize for any typographical errors that were not detected and corrected.      Ursula Alert, Summers 04/05/2022, 7:59 AM

## 2022-04-04 NOTE — Patient Instructions (Signed)
Naltrexone Tablets What is this medication? NALTREXONE (nal TREX one) treats opioid use disorder. It works by blocking the effect of opioids and reducing cravings to use opioids. It may also be used to treat alcohol use disorder. It belongs to a group of medications called opioid blockers. It is most effective when used in combination with counseling and behavior therapy. This medicine may be used for other purposes; ask your health care provider or pharmacist if you have questions. COMMON BRAND NAME(S): Depade, ReVia What should I tell my care team before I take this medication? They need to know if you have any of these conditions: Use of opioid-containing medications or alcohol within the past 7 to 10 days Kidney disease Liver disease An unusual or allergic reaction to naltrexone, other medications, foods, dyes, or preservatives Pregnant or trying to get pregnant Breast-feeding How should I use this medication? Take this medication by mouth with water. Take it as directed on the prescription label at the same time every day. Do not take this medication within 7 to 10 days of taking any opioid drugs. Keep taking it unless your care team tells you to stop. Talk to your care team about the use of this medication in children. Special care may be needed. Overdosage: If you think you have taken too much of this medicine contact a poison control center or emergency room at once. NOTE: This medicine is only for you. Do not share this medicine with others. What if I miss a dose? If you miss a dose and remember on the same day, take the missed dose. If you do not remember until the next day, ask your care team about rescheduling your doses. Do not take double or extra doses. What may interact with this medication? Do not take this medication with any of the following: Opioids, such as codeine, heroin, methadone This medication may also interact with the following: Disulfiram Thioridazine This list  may not describe all possible interactions. Give your health care provider a list of all the medicines, herbs, non-prescription drugs, or dietary supplements you use. Also tell them if you smoke, drink alcohol, or use illegal drugs. Some items may interact with your medicine. What should I watch for while using this medication? Your condition will be monitored carefully while you are receiving this medication. Visit your care team for regular checks on your progress. For this medication to be most effective, you should attend any counseling or support groups that your care team recommends. Do not try to overcome the effects of the medication by taking large amounts of opioids or by drinking large amounts of alcohol. This can cause severe problems, including death. Also, you may be more sensitive to lower doses of opioids after you stop taking this medication. If you are going to have surgery, tell your care team that you are taking this medication. Do not treat yourself for coughs, colds, pain, or diarrhea. Ask your care team for advice. Some ingredients may interact with this medication and cause side effects. Wear a medical ID bracelet or chain. Carry a card that describes your condition. List the medications and doses you take on the card. This medication may affect your coordination, reaction time, or judgment. Do not drive or operate machinery until you know how this medication affects you. Sit up or stand slowly to reduce the risk of dizzy or fainting spells. Drinking alcohol with this medication can increase the risk of these side effects. What side effects may I notice from  receiving this medication? Side effects that you should report to your care team as soon as possible: Allergic reactions--skin rash, itching, hives, swelling of the face, lips, tongue, or throat Liver injury--right upper belly pain, loss of appetite, nausea, light-colored stool, dark yellow or brown urine, yellowing skin or  eyes, unusual weakness or fatigue Thoughts of suicide or self-harm, worsening mood, feelings of depression Side effects that usually do not require medical attention (report to your care team if they continue or are bothersome): Anxiety, nervousness Dizziness Headache Fatigue Nausea Trouble sleeping This list may not describe all possible side effects. Call your doctor for medical advice about side effects. You may report side effects to FDA at 1-800-FDA-1088. Where should I keep my medication? Keep out of the reach of children and pets. Store at room temperature between 20 and 25 degrees C (68 and 77 degrees F). Get rid of any unused medication after the expiration date. To get rid of medications that are no longer needed or have expired: Take the medication to a medication take-back program. Check with your pharmacy or law enforcement to find a location. If you cannot return the medication, check the label or package insert to see if the medication should be thrown out in the garbage or flushed down the toilet. If you are not sure, ask your care team. If it is safe to put it in the trash, empty the medication out of the container. Mix the medication with cat litter, dirt, coffee grounds, or other unwanted substance. Seal the mixture in a bag or container. Put it in the trash. NOTE: This sheet is a summary. It may not cover all possible information. If you have questions about this medicine, talk to your doctor, pharmacist, or health care provider.  2023 Elsevier/Gold Standard (2021-03-16 00:00:00)

## 2022-04-09 ENCOUNTER — Encounter: Payer: Self-pay | Admitting: Family Medicine

## 2022-05-01 ENCOUNTER — Encounter: Payer: Self-pay | Admitting: Psychology

## 2022-05-01 ENCOUNTER — Encounter: Payer: 59 | Attending: Psychology | Admitting: Psychology

## 2022-05-01 DIAGNOSIS — F1010 Alcohol abuse, uncomplicated: Secondary | ICD-10-CM | POA: Insufficient documentation

## 2022-05-01 DIAGNOSIS — F4010 Social phobia, unspecified: Secondary | ICD-10-CM | POA: Insufficient documentation

## 2022-05-01 DIAGNOSIS — F320 Major depressive disorder, single episode, mild: Secondary | ICD-10-CM | POA: Insufficient documentation

## 2022-05-01 NOTE — Progress Notes (Signed)
Neuropsychological Evaluation   Patient:  Terry Summers   DOB: 1985/04/06  MR Number: [id number]  Location: [facility] [place], [id number] [place] [address] Dept: [phone]  Start: 10 AM End: 11 AM  Provider/Observer:     Edgardo Roys PsyD  Chief Complaint:      Chief Complaint  Patient presents with   Anxiety   Depression   Other    Attention and concentration difficulties    Reason For Service:     Jermonte Vandenbroek is a 37 year old male referred for neuropsychological evaluation by his treating psychiatrist Ursula Alert, MD to facilitate differential diagnosis with the possibility of an underlying adult residual attention deficit disorder. The patient who has a history of anxiety and depression with previous diagnosis of major depressive disorder, social anxiety, alcohol use disorder.  Patient had been tried on Lexapro back in the summer but discontinued this medicine as he felt it was not helping his symptoms and had some side effects.  Patient did report after stopping Lexapro that he felt like his mood was improved and did not feel as depressed as he had prior.  Patient also reported anxiety was improving.  Patient does continue to report that he has had longstanding difficulties with attention and concentration deficits and continues to have struggles with alcohol use.  Patient reports that he has consistently consumed 5 beers per day Monday through Thursdays then on weekends he drinks 10 beers per day.  He has described craving alcohol previously.  Patient's wife is reported to be concerned about his drinking.   During the clinical interview today, the patient reports that attentional issues have been present for well over 12 years and the patient has difficulty sitting still and is always on the go.  Patient reports that his memory is really bad and has  difficulty remembering schedules and what needs to be scheduled.  Patient describes primarily episodic memory difficulties as well as difficulties "filtering" what he is saying to others.  Patient reports longstanding difficulties with social communication and effectively communicating to others.  Patient reports that he did not remember having any specific or real difficulties with attention during elementary school and would often get his work done very quickly so he can do other things that he liked more.  Patient describes current symptoms including hyperactivity, impulsivity, forgetfulness and depressive symptomatology.  Patient reports that his anxiety symptoms started later in life and then he developed depressive symptomatology, which then combined to lead him to increasing alcohol use.  Patient reports that there is a strong family history of alcohol abuse and that his paternal grandfather died from excessive alcohol use.   The patient reports that he has never had a diagnosis of concussive event but reports that he did hit his head with great force on concrete once playing hockey and played soccer for years with lots of headers.  The patient denied ever experiencing events where he had lingering side effects or concussion.   The patient describes his sleep pattern to usually include 6 hours per night of sleep and feels like he sleeps pretty good.  Patient denies any history of significant hospitalizations or significant medical illness.  Tests Administered: Comprehensive Attention Battery (CAB) Continuous Performance Test (CPT)  Participation Level:   Active  Participation Quality:  Appropriate      Behavioral Observation:  The patient appeared well-groomed and appropriately dressed. His manners were  polite and appropriate to the situation. The patient's attitude toward testing was positive and he showed good effort.   Well Groomed, Alert, and Appropriate.   Test Results:   The patient  completed the comprehensive attention battery and the CAB CPT measure.  He appeared to approach this in an honest and straightforward manner putting forth good effort throughout.  This does appear to be a valid assessment and likely an accurate representation of his current attentional and executive functioning capacities.  Initially, the patient was administered the auditory/visual pure reaction time measures.  These are pure "Go" test with no requirements of inhibiting responses or decision making for responses beyond an instruction to respond to target stimuli which was either auditory or visual in nature.  On the pure visual reaction time measure the patient correctly responded to 50 of 50 targets with no errors of omission.  Average response time was 337 ms which was well within normative expectations.  Similar performance was identified for the pure auditory measure where he responded to 50 of 50 targets with an average response time of 554 ms which is 1.28 standard deviation slower than normative expectations.  This pattern suggest somewhat slower responding to auditory target stimuli than visual target stimuli.  There were no indications of difficulties inhibiting impulsive responses.  The patient was then administered the discriminate reaction time measures.  On the visual discriminate reaction time measure the patient correctly identified 33 of 35 targets with 3 errors of commission and 2 errors of omission.  This is within normative expectations.  Average response time was 586 ms which is at the upper end of the average range relative to normative expectation.  On the auditory discriminate reaction time measure he correctly identified 35 of 35 targets with 2 errors of commission and no errors of omission.  This is also within normative expectations.  Average response time was 888 ms which is 1.75 standard deviations slower than normative expectations.  This is consistent with patterns seen on the pure  auditory reaction time measures well.  On the shift discriminate reaction time measure the patient correctly identified 29 of 30 targets with 4 errors of commission and 1 error of omission.  These are within normative expectations.  Average response time was 0.93 standard deviations greater which is likely a combination of his auditory versus visual response times and is consistent with an average of those 2.  On the auditory/visual scan reaction time measures the patient continued to show a similar pattern.  On the visual scan reaction time measure he correctly responded to 40 of 40 targets with no errors of omission and no errors of commission.  Average response time was 832 ms which is slightly slower than normative expectations.  On the auditory scan reaction time measure he showed good accuracy scores with 39-40 targets responded to and only 1 error of omission.  These are within normal limits.  However, on the average response time component the patient average 1445 ms which is 1 and half standard deviation slower than normative expectations showing a consistent pattern of slower auditory processing versus visual processing.  On the auditory/visual encoding test the patient performed very well on the visual encoding measures both visual encoding forward and visual encoding backwards.  He showed some mild deficits or weaknesses with your auditory encoding but did better on the auditory backwards component.  This pattern suggest that he has some mild auditory encoding weaknesses but is able to actively process information in his auditory  Register.  Visual encoding is well within normative expectations.  The patient was then administered the Stroop interference cancellation test.  This is a measure assessing initially focus execute abilities where the patient is asked to perform a visual scanning/visual searching task and given a 15-second window to scan a great of 36 boxes were either the word red-green or  blue is printed in each box with the corresponding color font that either matches the word or is a mismatch to the word.  The patient has to tap on ones where the word and color match.  The patient was quite slow in this process averaging between 5 and 10 of the 18 targets identified correctly within the time limit.  The patient had almost no missed items during these measures.  After the first 4 none interference trials it is changed to a targeted auditory interference with the patient performing the exact same task but with just auditory interference.  The patient initially had difficulty adjusting first interference trial dropping from the last interference trial of 8 of 18 targets identified to only getting 5 of the 18 targets with the first interference trial.  However, by the second series and on throughout the last 2 the patient returned to his baseline level of function in the 9 interference trials getting 9 of 18 targets on each of the final 3 interference trials.  This pattern suggest slowed auditory and reading processing of information but no indication of significant difficulty avoiding targeted auditory interference or auditory distractibility.  Finally, the patient was administered the visual monitor continuous performance measure to assess sustaining attention.  We utilized a visual component as the patient is shown some difficulties with regard to auditory processing without a similar pattern for visual processing.  This 15-minute task is broken down into five 3-minute blocks of time for analysis.  On the first 3 minutes of this task the patient correctly identified 28 of 30 targets with 2 errors of omission and no errors of cognition.  His average response time was 855 ms which is at the slower end of the average range for his age group.  The patient showed a very similar pattern on each of the next 3-minute blocks of time.  For example he consistently got between 2 and 5 errors of omission for  each element of time without progressive changes as a function of time.  His average response time slowed only very slightly across the entire 15-minute task and in the 9-12-minute epic of time his average response time was 905 ms which is only 10 ms slower.  And on the final 3-minute task his average was 956 ms which is a very level of slowing as a function of time.  The results of this measure do show some lapses of attention happening consistently regardless of duration of measure.  There was only a very slight decrease in average response time as a function of time  Summary of Results:   The results of the current objective assessment of a wide range of attentional and executive functioning capacity do show a consistent pattern.  The patient showed to regular pattern of greater difficulty on the auditory attentional measures versus visual attentional measures.  The patient showed much slower auditory pure reaction times and auditory discriminate reaction times with similar good accuracy scores on both.  The patient showed minimal difficulties with external targeted distractors but showed consistent slowed processing of auditory/written elements.  Patient showed slowed information processing speed (focus execute abilities)  but greater for auditory versus visual.  The patient did very well on visual encoding measures but had some greater difficulty for auditory encoding tickly pure auditory encoding.  The auditory information that he was able to effectively encode was available for processing through his active auditory Register.  There did not appear to be particular difficulties with shifting attention particularly if it was visual in nature and no particular deficits in sustaining attention, deficits for inhibiting impulsive types responses or vulnerable to external distractors.  There was a consistent pattern of greater weaknesses with regard to attentional functions for measures of auditory versus visual  attentional components.  Impression/Diagnosis:   The results of the current neuropsychological evaluation taking into account available medical records, subjective/clinical history and current objective assessment of a wide range of attention and concentration and executive functioning are not consistent with a pattern typically seen with adult residual attention deficit disorder.  The patient appears to have developed the primary attentional issues in adulthood that also were correlated with increases of anxiety and mood changes.  The patient does not appear to have particularly patterns of hyperkinesis or hyperactive types of symptoms.  The patient does have attentional deficits but these are much more consistent with auditory attentional deficits seen with both primary auditory encoding, auditory processing speed without visual processing speed deficits.  The patient does much better on visual encoding components.  He patient showed no consistent pattern of significant deficits with regard to sustaining attention, freedom from distractibility or shifting attentional deficits.  The attentional issues are consistent with those that would typically be seen for anxiety/depressive disorders, impacts of insomnia etc.  There are also indications of a potential auditory processing disorder explaining his attentional deficits.  While the patient did well in school and there were no indications of any learning disabilities this may be artifact of his increased caution related to attentional components.  Given the results of the current neuropsychological evaluation and the clinical history/presentation the patient does not appear to be a particularly good candidate for use of psychostimulant type medications.  The patient was tried on an SSRI medication previously but reports that he did not feel like he responded particularly well.  However, this was also during a time when he was continuing to regularly consume alcohol  and alcohol has the potential to reduce the effectiveness of SSRI medications.  I would strongly encourage the patient to work on significantly decreasing or completely stopping alcohol at least for an extended period of time to allow for potentially improved sleep architecture when he is sleeping and to have another trial of SSRI medication to see if there is a better response.  I suspect that anxiety/depressive symptomatology.  Regular alcohol use, suboptimal duration of sleep and significant impact of alcohol on sleep architecture are likely playing a primary role in the patient's attentional issues.  Diagnosis:    Current mild episode of major depressive disorder without prior episode [facility])  Social anxiety disorder  Alcohol use disorder, mild, abuse   _____________________ Ilean Skill, Psy.D. Clinical Neuropsychologist

## 2022-05-13 ENCOUNTER — Encounter: Payer: Self-pay | Admitting: Psychiatry

## 2022-05-13 ENCOUNTER — Ambulatory Visit (INDEPENDENT_AMBULATORY_CARE_PROVIDER_SITE_OTHER): Payer: 59 | Admitting: Psychiatry

## 2022-05-13 VITALS — BP 120/77 | HR 69 | Temp 98.1°F | Ht 70.0 in | Wt 195.2 lb

## 2022-05-13 DIAGNOSIS — F4010 Social phobia, unspecified: Secondary | ICD-10-CM | POA: Diagnosis not present

## 2022-05-13 DIAGNOSIS — F3342 Major depressive disorder, recurrent, in full remission: Secondary | ICD-10-CM | POA: Diagnosis not present

## 2022-05-13 DIAGNOSIS — G4709 Other insomnia: Secondary | ICD-10-CM | POA: Diagnosis not present

## 2022-05-13 DIAGNOSIS — R41840 Attention and concentration deficit: Secondary | ICD-10-CM

## 2022-05-13 DIAGNOSIS — F19980 Other psychoactive substance use, unspecified with psychoactive substance-induced anxiety disorder: Secondary | ICD-10-CM | POA: Insufficient documentation

## 2022-05-13 DIAGNOSIS — F1010 Alcohol abuse, uncomplicated: Secondary | ICD-10-CM | POA: Diagnosis not present

## 2022-05-13 MED ORDER — TRAZODONE HCL 100 MG PO TABS: 100.0000 mg | ORAL_TABLET | Freq: Every evening | ORAL | 1 refills | Status: DC | PRN

## 2022-05-13 NOTE — Patient Instructions (Signed)
Trazodone Tablets What is this medication? TRAZODONE (TRAZ oh done) treats depression. It increases the amount of serotonin in the brain, a hormone that helps regulate mood. This medicine may be used for other purposes; ask your health care provider or pharmacist if you have questions. COMMON BRAND NAME(S): Desyrel What should I tell my care team before I take this medication? They need to know if you have any of these conditions: Attempted suicide or thinking about it Bipolar disorder Bleeding problems Glaucoma Heart disease, or previous heart attack Irregular heart beat Kidney or liver disease Low levels of sodium in the blood An unusual or allergic reaction to trazodone, other medications, foods, dyes or preservatives Pregnant or trying to get pregnant Breast-feeding How should I use this medication? Take this medication by mouth with a glass of water. Follow the directions on the prescription label. Take this medication shortly after a meal or a light snack. Take your medication at regular intervals. Do not take your medication more often than directed. Do not stop taking this medication suddenly except upon the advice of your care team. Stopping this medication too quickly may cause serious side effects or your condition may worsen. A special MedGuide will be given to you by the pharmacist with each prescription and refill. Be sure to read this information carefully each time. Talk to your care team regarding the use of this medication in children. Special care may be needed. Overdosage: If you think you have taken too much of this medicine contact a poison control center or emergency room at once. NOTE: This medicine is only for you. Do not share this medicine with others. What if I miss a dose? If you miss a dose, take it as soon as you can. If it is almost time for your next dose, take only that dose. Do not take double or extra doses. What may interact with this medication? Do not  take this medication with any of the following: Certain medications for fungal infections like fluconazole, itraconazole, ketoconazole, posaconazole, voriconazole Cisapride Dronedarone Linezolid MAOIs like Carbex, Eldepryl, Marplan, Nardil, and Parnate Mesoridazine Methylene blue (injected into a vein) Pimozide Saquinavir Thioridazine This medication may also interact with the following: Alcohol Antiviral medications for HIV or AIDS Aspirin and aspirin-like medications Barbiturates like phenobarbital Certain medications for blood pressure, heart disease, irregular heart beat Certain medications for depression, anxiety, or psychotic disturbances Certain medications for migraine headache like almotriptan, eletriptan, frovatriptan, naratriptan, rizatriptan, sumatriptan, zolmitriptan Certain medications for seizures like carbamazepine and phenytoin Certain medications for sleep Certain medications that treat or prevent blood clots like dalteparin, enoxaparin, warfarin Digoxin Fentanyl Lithium NSAIDS, medications for pain and inflammation, like ibuprofen or naproxen Other medications that prolong the QT interval (cause an abnormal heart rhythm) like dofetilide Rasagiline Supplements like St. John's wort, kava kava, valerian Tramadol Tryptophan This list may not describe all possible interactions. Give your health care provider a list of all the medicines, herbs, non-prescription drugs, or dietary supplements you use. Also tell them if you smoke, drink alcohol, or use illegal drugs. Some items may interact with your medicine. What should I watch for while using this medication? Tell your care team if your symptoms do not get better or if they get worse. Visit your care team for regular checks on your progress. Because it may take several weeks to see the full effects of this medication, it is important to continue your treatment as prescribed by your care team. Watch for new or worsening  thoughts of   suicide or depression. This includes sudden changes in mood, behaviors, or thoughts. These changes can happen at any time but are more common in the beginning of treatment or after a change in dose. Call your care team right away if you experience these thoughts or worsening depression. Manic episodes may happen in patients with bipolar disorder who take this medication. Watch for changes in feelings or behaviors such as feeling anxious, nervous, agitated, panicky, irritable, hostile, aggressive, impulsive, severely restless, overly excited and hyperactive, or trouble sleeping. These changes can happen at any time but are more common in the beginning of treatment or after a change in dose. Call your care team right away if you notice any of these symptoms. You may get drowsy or dizzy. Do not drive, use machinery, or do anything that needs mental alertness until you know how this medication affects you. Do not stand or sit up quickly, especially if you are an older patient. This reduces the risk of dizzy or fainting spells. Alcohol may interfere with the effect of this medication. Avoid alcoholic drinks. This medication may cause dry eyes and blurred vision. If you wear contact lenses you may feel some discomfort. Lubricating drops may help. See your eye doctor if the problem does not go away or is severe. Your mouth may get dry. Chewing sugarless gum, sucking hard candy and drinking plenty of water may help. Contact your care team if the problem does not go away or is severe. What side effects may I notice from receiving this medication? Side effects that you should report to your care team as soon as possible: Allergic reactions--skin rash, itching, hives, swelling of the face, lips, tongue, or throat Bleeding--bloody or black, tar-like stools, red or dark brown urine, vomiting blood or brown material that looks like coffee grounds, small, red or purple spots on skin, unusual bleeding or  bruising Heart rhythm changes--fast or irregular heartbeat, dizziness, feeling faint or lightheaded, chest pain, trouble breathing Low blood pressure--dizziness, feeling faint or lightheaded, blurry vision Low sodium level--muscle weakness, fatigue, dizziness, headache, confusion Prolonged or painful erection Serotonin syndrome--irritability, confusion, fast or irregular heartbeat, muscle stiffness, twitching muscles, sweating, high fever, seizures, chills, vomiting, diarrhea Sudden eye pain or change in vision such as blurry vision, seeing halos around lights, vision loss Thoughts of suicide or self-harm, worsening mood, feelings of depression Side effects that usually do not require medical attention (report to your care team if they continue or are bothersome): Change in sex drive or performance Constipation Dizziness Drowsiness Dry mouth This list may not describe all possible side effects. Call your doctor for medical advice about side effects. You may report side effects to FDA at 1-800-FDA-1088. Where should I keep my medication? Keep out of the reach of children and pets. Store at room temperature between 15 and 30 degrees C (59 to 86 degrees F). Protect from light. Keep container tightly closed. Throw away any unused medication after the expiration date. NOTE: This sheet is a summary. It may not cover all possible information. If you have questions about this medicine, talk to your doctor, pharmacist, or health care provider.  2023 Elsevier/Gold Standard (2007-03-21 00:00:00)

## 2022-05-13 NOTE — Progress Notes (Signed)
[facility] OP Progress Note  05/13/2022 5:08 PM Terry Summers  MRN:  [id number]  Chief Complaint:  Chief Complaint  Patient presents with   Follow-up   Medication Refill   Insomnia   HPI: Terry Summers is a 37 year old Caucasian male, employed, married, lives in [place], has a history of MDD, social anxiety disorder, alcohol use disorder, vitamin D deficiency was evaluated in office today.  Patient today reports he continues to drink alcohol, 5-6 drinks, beers throughout the day on a Friday or Saturday.  He however has cut back a lot on his alcohol use and reports he currently does not have any cravings during the week.  Patient however continues to struggle with sleep.  Patient reports he has difficulty falling asleep and maintaining sleep.  Patient wonders why his sleep and attention are getting worse since he stopped drinking.  Patient had neuropsychological testing done by Dr. Sima Matas.  I have reviewed notes and discussed with patient dated 05/01/2022.  Patient does not meet criteria for ADHD.  Attention and focus problems likely due to her sleep issues, symptom of depression or anxiety.  Patient denies any suicidality, homicidality or perceptual disturbances.  Patient agreeable to a retrial of trazodone at a higher dosage.  Reports trazodone 50 mg did not help.  Patient reports he has been using the gabapentin only as needed and that has been beneficial.  Patient denies any other concerns today.  Visit Diagnosis:    ICD-10-CM   1. MDD (major depressive disorder), recurrent, in full remission  F33.42     2. Social anxiety disorder  F40.10     3. Other insomnia  G47.09 traZODone (DESYREL) 100 MG tablet    4. Alcohol use disorder, mild, abuse  F10.10     5. Attention and concentration deficit  R41.840       Past Psychiatric History: I have reviewed past psychiatric history from progress note on 05/08/2021.  Past trials of sertraline-sexual side effects, nausea, Lexapro-side  effects.  Past Medical History:  Past Medical History:  Diagnosis Date   Anxiety    Depression     Past Surgical History:  Procedure Laterality Date   APPENDECTOMY  2011   LASIK Bilateral 2013   VASECTOMY  2019   WISDOM TOOTH EXTRACTION Bilateral 2006    Family Psychiatric History: Reviewed family psychiatric history from progress note on 05/08/2021.  Family History:  Family History  Problem Relation Age of Onset   Stroke Paternal Grandfather    Stroke Paternal Grandmother    ADD / ADHD Son    Prostate cancer Neg Hx    Kidney cancer Neg Hx    Bladder Cancer Neg Hx     Social History: Reviewed social history from progress note on 05/08/2021. Social History   Socioeconomic History   Marital status: Married    Spouse name: [name]   Number of children: 2   Years of education: 14   Highest education level: Associate degree: academic program  Occupational History   Not on file  Tobacco Use   Smoking status: Never    Passive exposure: Never   Smokeless tobacco: Never  Vaping Use   Vaping Use: Never used  Substance and Sexual Activity   Alcohol use: Yes    Alcohol/week: 15.0 standard drinks of alcohol    Types: 15 Cans of beer per week   Drug use: Not Currently   Sexual activity: Yes    Partners: Female    Birth control/protection: Surgical  Other  Topics Concern   Not on file  Social History Narrative   Not on file   Social Determinants of Health   Financial Resource Strain: Not on file  Food Insecurity: Not on file  Transportation Needs: Not on file  Physical Activity: Not on file  Stress: Not on file  Social Connections: Not on file    Allergies:  Allergies  Allergen Reactions   Penicillins Other (See Comments)    Pt states rash/unknown "I was a baby when it happened".     Metabolic Disorder Labs: No results found for: "HGBA1C", "MPG" No results found for: "PROLACTIN" Lab Results  Component Value Date   CHOL 173 03/26/2022   TRIG 186  (H) 03/26/2022   HDL 53 03/26/2022   CHOLHDL 3.3 03/26/2022   LDLCALC 88 03/26/2022   LDLCALC 138 (H) 03/21/2021   Lab Results  Component Value Date   TSH 2.830 03/26/2022   TSH 3.200 03/21/2021    Therapeutic Level Labs: No results found for: "LITHIUM" No results found for: "VALPROATE" No results found for: "CBMZ"  Current Medications: Current Outpatient Medications  Medication Sig Dispense Refill   gabapentin (NEURONTIN) 100 MG capsule Take 1 capsule (100 mg total) by mouth 2 (two) times daily. (Patient taking differently: Take 100 mg by mouth 2 (two) times daily. Uses as needed) 60 capsule 1   traZODone (DESYREL) 100 MG tablet Take 1-1.5 tablets (100-150 mg total) by mouth at bedtime as needed for sleep. 45 tablet 1   clotrimazole-betamethasone (LOTRISONE) cream Apply 1 Application topically 2 (two) times daily. (Patient not taking: Reported on 05/13/2022) 45 g 0   Vitamin D, Ergocalciferol, (DRISDOL) 1.25 MG (50000 UNIT) CAPS capsule Take 1 capsule (50,000 Units total) by mouth every 7 (seven) days. Take for 8 total doses(weeks) (Patient not taking: Reported on 05/13/2022) 8 capsule 0   No current facility-administered medications for this visit.     Musculoskeletal: Strength & Muscle Tone: within normal limits Gait & Station: normal Patient leans: N/A  Psychiatric Specialty Exam: Review of Systems  Psychiatric/Behavioral:  Positive for decreased concentration and sleep disturbance. The patient is nervous/anxious.   All other systems reviewed and are negative.   Blood pressure 120/77, pulse 69, temperature 98.1 F (36.7 C), temperature source Skin, height 5\' 10"  (1.778 m), weight 195 lb 3.2 oz (88.5 kg).Body mass index is 28.01 kg/m.  General Appearance: Casual  Eye Contact:  Fair  Speech:  Clear and Coherent  Volume:  Normal  Mood:  Anxious  Affect:  Appropriate  Thought Process:  Goal Directed and Descriptions of Associations: Intact  Orientation:  Full (Time,  Place, and Person)  Thought Content: Logical   Suicidal Thoughts:  No  Homicidal Thoughts:  No  Memory:  Immediate;   Fair Recent;   Fair Remote;   Fair  Judgement:  Fair  Insight:  Fair  Psychomotor Activity:  Normal  Concentration:  Concentration: Fair and Attention Span: Fair  Recall:  [employer] of Knowledge: Fair  Language: Fair  Akathisia:  No  Handed:  Right  AIMS (if indicated): not done  Assets:  Communication Skills Desire for Improvement Housing Social Support  ADL's:  Intact  Cognition: WNL  Sleep:  Poor   Screenings: Administrator, Civil Service Office Visit from 06/14/2021 in [place] Office Visit from 05/08/2021 in [place] Total Score 0 0      AUDIT    Flowsheet Row  Office Visit from 04/04/2022 in [place]  Alcohol Use Disorder Identification Test Final Score (AUDIT) 14      GAD-7    Flowsheet Row Office Visit from 05/13/2022 in [place] Office Visit from 04/04/2022 in [place] Office Visit from 03/20/2022 in [place] Office Visit from 03/19/2022 in [place] [facility] [place] Visit from 10/03/2021 in [place]  Total GAD-7 Score 4 4 6 10  0      PHQ2-9    [place] Office Visit from 05/13/2022 in [place] Office Visit from 04/04/2022 in [place] Office Visit from 03/20/2022 in [place] Office Visit from 03/19/2022 in [place] [facility] [place] Visit from 10/03/2021 in [place]  PHQ-2 Total Score 0 1 0 0 1   PHQ-9 Total Score 3 3 3 6  --      [place] Office Visit from 05/13/2022 in [place] Office Visit from 04/04/2022 in [place] Office Visit from 03/20/2022 in [place] No Risk No Risk No Risk        Assessment and Plan: Wrangler Kleven is a 37 year old Caucasian male, employed, married, lives in [place], has a history of depression, anxiety, alcoholism was evaluated in office today.  Patient currently struggling with sleep problems, attention and focus problems, will benefit from the following plan.  Plan MDD in remission Continue CBT  Social anxiety disorder-improving Continue CBT with Ms. Benita Gutter  Alcohol use disorder mild-improving Gabapentin 100 mg p.o. daily, uses it only as needed although prescribed to take 100 mg twice daily. Patient is not interested in naltrexone at this time Continue CBT  Insomnia-unstable Likely multifactorial including history of alcoholism, questionable anxiety symptoms. Start trazodone 100-150 mg p.o. nightly as needed Patient to work on sleep hygiene techniques Patient to abstain from alcohol use.  Attention and concentration deficit-unstable Patient completed neuropsychological testing-05/01/2022-with Dr. Leonard Schwartz and discussed the same with patient.  Patient does not meet criteria for ADHD.  Collaboration of Care: Collaboration of Care: Other reviewed and discussed ADHD testing-patient does not meet criteria, discussed with patient.  Patient/Guardian was advised Release of Information must be obtained prior to any record release in order to collaborate their care with an outside provider. Patient/Guardian was advised if they have not already done so to contact the registration department to sign all necessary forms in order for [place] to release information regarding their care.    Consent: Patient/Guardian gives verbal consent for treatment and assignment of benefits for services provided during this visit. Patient/Guardian expressed understanding and agreed to proceed.   Follow-up in clinic in 8 weeks or sooner if needed.  This note was generated in part or whole with voice recognition software. Voice recognition is usually quite accurate but there are transcription errors that can and very often do occur. I apologize for any typographical errors that were not detected and corrected.    Ursula Alert, MD 05/15/2022, 12:13 PM

## 2022-05-27 ENCOUNTER — Other Ambulatory Visit: Payer: Self-pay | Admitting: Psychiatry

## 2022-05-27 DIAGNOSIS — G4709 Other insomnia: Secondary | ICD-10-CM

## 2022-07-16 ENCOUNTER — Ambulatory Visit (INDEPENDENT_AMBULATORY_CARE_PROVIDER_SITE_OTHER): Payer: 59 | Admitting: Psychiatry

## 2022-07-16 ENCOUNTER — Encounter: Payer: Self-pay | Admitting: Psychiatry

## 2022-07-16 VITALS — BP 121/79 | HR 74 | Temp 98.1°F | Ht 70.0 in | Wt 196.0 lb

## 2022-07-16 DIAGNOSIS — F1010 Alcohol abuse, uncomplicated: Secondary | ICD-10-CM | POA: Diagnosis not present

## 2022-07-16 DIAGNOSIS — F3342 Major depressive disorder, recurrent, in full remission: Secondary | ICD-10-CM | POA: Diagnosis not present

## 2022-07-16 DIAGNOSIS — G4709 Other insomnia: Secondary | ICD-10-CM

## 2022-07-16 DIAGNOSIS — F4010 Social phobia, unspecified: Secondary | ICD-10-CM

## 2022-07-16 DIAGNOSIS — R41840 Attention and concentration deficit: Secondary | ICD-10-CM

## 2022-07-16 NOTE — Progress Notes (Unsigned)
[facility] OP Progress Note  07/16/2022 4:26 PM Terry Summers  MRN:  [id number]  Chief Complaint:  Chief Complaint  Patient presents with   Follow-up   Anxiety   Medication Refill   HPI: Terry Summers is a 37 year old Caucasian male, employed, married, lives in [place], has a history of MDD, social anxiety disorder, alcohol use disorder, vitamin D deficiency was evaluated in office today.  Patient today reports he continues to have anxiety mostly about his relationship struggles with his wife.  They are currently in family counseling.  Patient believes it is going in the right direction although it continues to make him anxious.  He does have individual therapist-follows up with Ms. Terry Summers.  Patient however reports his therapist is going away on maternity leave soon and hence he will not have a therapist until November 2024.  He was provided resources in the community however he does not want to establish care with a new therapist at this time.  He believes he will be okay without any therapy on an individual basis until his therapist returns.  Patient reports he has been cutting back on his alcohol intake.  Currently drinks around 5 drinks on weekends.  Patient denies any significant craving.  Currently noncompliant on gabapentin.  Uses it once a week or so.  Patient reports sleep has improved.  He does not use the trazodone much.  He is sleeping without any medications at this time.  Patient denies any suicidality, homicidality or perceptual disturbances.  Looks forward to his appointment with Dr. Kieth Summers for his results for ADHD testing.  Patient denies any other concerns today.  Visit Diagnosis:    ICD-10-CM   1. MDD (major depressive disorder), recurrent, in full remission (HCC)  F33.42     2. Social anxiety disorder  F40.10     3. Other insomnia  G47.09     4. Alcohol use disorder, mild, abuse  F10.10     5. Attention and concentration deficit  R41.840       Past Psychiatric  History: I have reviewed past psychiatric history from progress note on 05/08/2021.  Past trials of sertraline-sexual side effects, nausea, Lexapro-side effects.  Past Medical History:  Past Medical History:  Diagnosis Date   Anxiety    Depression     Past Surgical History:  Procedure Laterality Date   APPENDECTOMY  2011   LASIK Bilateral 2013   VASECTOMY  2019   WISDOM TOOTH EXTRACTION Bilateral 2006    Family Psychiatric History: Reviewed family psychiatric history from progress note on 05/08/2021.  Family History:  Family History  Problem Relation Age of Onset   Stroke Paternal Grandfather    Stroke Paternal Grandmother    ADD / ADHD Son    Prostate cancer Neg Hx    Kidney cancer Neg Hx    Bladder Cancer Neg Hx     Social History: Reviewed social history from progress note on 05/08/2021. Social History   Socioeconomic History   Marital status: Married    Spouse name: Terry Summers   Number of children: 2   Years of education: 14   Highest education level: Associate degree: academic program  Occupational History   Not on file  Tobacco Use   Smoking status: Never    Passive exposure: Never   Smokeless tobacco: Never  Vaping Use   Vaping Use: Never used  Substance and Sexual Activity   Alcohol use: Yes    Alcohol/week: 15.0 standard drinks of alcohol  Types: 15 Cans of beer per week   Drug use: Not Currently   Sexual activity: Yes    Partners: Female    Birth control/protection: Surgical  Other Topics Concern   Not on file  Social History Narrative   Not on file   Social Determinants of Health   Financial Resource Strain: Not on file  Food Insecurity: Not on file  Transportation Needs: Not on file  Physical Activity: Not on file  Stress: Not on file  Social Connections: Not on file    Allergies:  Allergies  Allergen Reactions   Penicillins Other (See Comments)    Pt states rash/unknown "I was a baby when it happened".     Metabolic Disorder  Labs: No results found for: "HGBA1C", "MPG" No results found for: "PROLACTIN" Lab Results  Component Value Date   CHOL 173 03/26/2022   TRIG 186 (H) 03/26/2022   HDL 53 03/26/2022   CHOLHDL 3.3 03/26/2022   LDLCALC 88 03/26/2022   LDLCALC 138 (H) 03/21/2021   Lab Results  Component Value Date   TSH 2.830 03/26/2022   TSH 3.200 03/21/2021    Therapeutic Level Labs: No results found for: "LITHIUM" No results found for: "VALPROATE" No results found for: "CBMZ"  Current Medications: Current Outpatient Medications  Medication Sig Dispense Refill   clotrimazole-betamethasone (LOTRISONE) cream Apply 1 Application topically 2 (two) times daily. (Patient not taking: Reported on 05/13/2022) 45 g 0   gabapentin (NEURONTIN) 100 MG capsule Take 1 capsule (100 mg total) by mouth 2 (two) times daily. (Patient taking differently: Take 100 mg by mouth 2 (two) times daily. Uses as needed) 60 capsule 1   traZODone (DESYREL) 100 MG tablet TAKE 1-1.5 TABLETS (100-150 MG TOTAL) BY MOUTH AT BEDTIME AS NEEDED FOR SLEEP. 135 tablet 0   Vitamin D, Ergocalciferol, (DRISDOL) 1.25 MG (50000 UNIT) CAPS capsule Take 1 capsule (50,000 Units total) by mouth every 7 (seven) days. Take for 8 total doses(weeks) (Patient not taking: Reported on 05/13/2022) 8 capsule 0   No current facility-administered medications for this visit.     Musculoskeletal: Strength & Muscle Tone: within normal limits Gait & Station: normal Patient leans: N/A  Psychiatric Specialty Exam: Review of Systems  Psychiatric/Behavioral:  Positive for sleep disturbance. The patient is nervous/anxious.     Blood pressure 121/79, pulse 74, temperature 98.1 F (36.7 C), temperature source Skin, height 5\' 10"  (1.778 m), weight 196 lb (88.9 kg).Body mass index is 28.12 kg/m.  General Appearance: Casual  Eye Contact:  Fair  Speech:  Clear and Coherent  Volume:  Normal  Mood:  Anxious  Affect:  Congruent  Thought Process:  Goal Directed and  Descriptions of Associations: Intact  Orientation:  Full (Time, Place, and Person)  Thought Content: Logical   Suicidal Thoughts:  No  Homicidal Thoughts:  No  Memory:  Immediate;   Fair Recent;   Fair Remote;   Fair  Judgement:  Fair  Insight:  Good  Psychomotor Activity:  Normal  Concentration:  Concentration: Fair and Attention Span: Fair  Recall:  [employer] of Knowledge: Fair  Language: Fair  Akathisia:  No  Handed:  Right  AIMS (if indicated): not done  Assets:  Communication Skills Desire for Improvement Housing Social Support  ADL's:  Intact  Cognition: WNL  Sleep:   Improving   Screenings: AIMS    Flowsheet Row Office Visit from 06/14/2021 in [facility] Office Visit from 05/08/2021 in [facility]  AIMS Total Score 0 0      AUDIT    Flowsheet Row Office Visit from 04/04/2022 in [facility]  Alcohol Use Disorder Identification Test Final Score (AUDIT) 14      GAD-7    Flowsheet Row Office Visit from 07/16/2022 in [facility] Office Visit from 05/13/2022 in [facility] Office Visit from 04/04/2022 in [facility] Office Visit from 03/20/2022 in [facility] Office Visit from 03/19/2022 in [facility] at [facility]  Total GAD-7 Score 3 4 4 6 10       PHQ2-9    Flowsheet Row Office Visit from 07/16/2022 in [facility] Office Visit from 05/13/2022 in [facility] Office Visit from 04/04/2022 in [facility] Office Visit from 03/20/2022 in [facility] Office Visit from 03/19/2022 in [facility] &  [facility] at [facility]  PHQ-2 Total Score 0 0 1 0 0  PHQ-9 Total Score 0 3 3 3 6       Flowsheet Row Office Visit from 07/16/2022 in [facility] Office Visit from 05/13/2022 in [facility] Office Visit from 04/04/2022 in [facility]  C-SSRS RISK CATEGORY No Risk No Risk No Risk        Assessment and Plan: Egan Hendler is a 37 year old Caucasian male, employed, married, lives in [place] has a history of depression, anxiety, alcoholism was evaluated in office today.  Patient is currently improving although continues to have anxiety, noncompliant with his medications like gabapentin, will benefit from the following plan.  Plan MDD in remission Continue CBT  Social anxiety disorder-improving Continue CBT with Ms. Terry Summers.  Alcohol use disorder mild-improving Patient to continue to work on cutting back. Patient encouraged to start using gabapentin 100 mg p.o. daily. Patient declines naltrexone at this time. Continue CBT  Insomnia-improving Trazodone 100-150 mg p.o. nightly as needed  Attention and concentration deficit-patient has upcoming appointment with neuropsychologist.  Follow-up in clinic in 3 to 4 months or sooner if needed.   Collaboration of Care: Collaboration of Care: Other encouraged to continue CBT.  Patient/Guardian was advised Release of Information must be obtained prior to any record release in order to collaborate their care with an outside provider. Patient/Guardian was advised if they have not already done so to contact the registration department to sign all necessary forms in order for [place] to release information regarding their care.   Consent: Patient/Guardian gives verbal consent for treatment and assignment of benefits for services provided during this visit. Patient/Guardian expressed understanding and agreed to proceed.    This note was generated in part or whole with voice recognition software. Voice recognition is usually quite accurate but there are transcription errors that can and very often do occur. I apologize for any typographical errors that were not detected and corrected.    Terry Longs, MD 07/17/2022, 10:03 AM

## 2022-08-08 ENCOUNTER — Encounter: Payer: Managed Care, Other (non HMO) | Attending: Psychology | Admitting: Psychology

## 2022-08-08 DIAGNOSIS — R41840 Attention and concentration deficit: Secondary | ICD-10-CM | POA: Diagnosis not present

## 2022-08-08 DIAGNOSIS — F4010 Social phobia, unspecified: Secondary | ICD-10-CM | POA: Insufficient documentation

## 2022-08-08 DIAGNOSIS — F320 Major depressive disorder, single episode, mild: Secondary | ICD-10-CM | POA: Diagnosis present

## 2022-08-08 DIAGNOSIS — F1010 Alcohol abuse, uncomplicated: Secondary | ICD-10-CM | POA: Insufficient documentation

## 2022-09-08 ENCOUNTER — Encounter: Payer: Self-pay | Admitting: Psychology

## 2022-09-08 NOTE — Progress Notes (Signed)
Neuropsychological Evaluation   Patient:  Terry Summers   DOB: Jun 13, 1985  MR Number: [id number]  Location: [facility] [address] [place], [address] [place] [address] Dept: [phone]  Start: 11 AM End: 12 PM  Provider/Observer:     Hershal Coria PsyD  Chief Complaint:      Chief Complaint  Patient presents with   Anxiety   Depression   Other    Attention and concentration difficulties   08/08/2022: Today I provided feedback regarding the results of the recent neuropsychological evaluation.  I have included the reason for service and impression/summary below for convenience.  We reviewed the results both with regard to diagnostic considerations and spent a great deal of time talking about treatment recommendations for the patient to continue on his own.  The patient will be continuing with his treating psychiatrist and feels like they are moving in a very healthy direction and he is responding well to psychotropic interventions.  The complete neuropsychological evaluation can be found in the patient's EMR dated 05/01/2022.   Reason For Service:     Terry Summers is a 37 year old male referred for neuropsychological evaluation by his treating psychiatrist Terry Longs, MD to facilitate differential diagnosis with the possibility of an underlying adult residual attention deficit disorder. The patient who has a history of anxiety and depression with previous diagnosis of major depressive disorder, social anxiety, alcohol use disorder.  Patient had been tried on Lexapro back in the summer but discontinued this medicine as he felt it was not helping his symptoms and had some side effects.  Patient did report after stopping Lexapro that he felt like his mood was improved and did not feel as depressed as he had prior.  Patient also reported anxiety was improving.  Patient does continue to report that he  has had longstanding difficulties with attention and concentration deficits and continues to have struggles with alcohol use.  Patient reports that he has consistently consumed 5 beers per day Monday through Thursdays then on weekends he drinks 10 beers per day.  He has described craving alcohol previously.  Patient's wife is reported to be concerned about his drinking.   During the clinical interview today, the patient reports that attentional issues have been present for well over 12 years and the patient has difficulty sitting still and is always on the go.  Patient reports that his memory is really bad and has difficulty remembering schedules and what needs to be scheduled.  Patient describes primarily episodic memory difficulties as well as difficulties "filtering" what he is saying to others.  Patient reports longstanding difficulties with social communication and effectively communicating to others.  Patient reports that he did not remember having any specific or real difficulties with attention during elementary school and would often get his work done very quickly so he can do other things that he liked more.  Patient describes current symptoms including hyperactivity, impulsivity, forgetfulness and depressive symptomatology.  Patient reports that his anxiety symptoms started later in life and then he developed depressive symptomatology, which then combined to lead him to increasing alcohol use.  Patient reports that there is a strong family history of alcohol abuse and that his paternal grandfather died from excessive alcohol use.   The patient reports that he has never had a diagnosis of concussive event but reports that he did hit his head with great force on concrete once playing hockey and  played soccer for years with lots of headers.  The patient denied ever experiencing events where he had lingering side effects or concussion.   The patient describes his sleep pattern to usually include 6 hours  per night of sleep and feels like he sleeps pretty good.  Patient denies any history of significant hospitalizations or significant medical illness.   Impression/Diagnosis:   The results of the current neuropsychological evaluation taking into account available medical records, subjective/clinical history and current objective assessment of a wide range of attention and concentration and executive functioning are not consistent with a pattern typically seen with adult residual attention deficit disorder.  The patient appears to have developed the primary attentional issues in adulthood that also were correlated with increases of anxiety and mood changes.  The patient does not appear to have particularly patterns of hyperkinesis or hyperactive types of symptoms.  The patient does have attentional deficits but these are much more consistent with auditory attentional deficits seen with both primary auditory encoding, auditory processing speed without visual processing speed deficits.  The patient does much better on visual encoding components.  He patient showed no consistent pattern of significant deficits with regard to sustaining attention, freedom from distractibility or shifting attentional deficits.  The attentional issues are consistent with those that would typically be seen for anxiety/depressive disorders, impacts of insomnia etc.  There are also indications of a potential auditory processing disorder explaining his attentional deficits.  While the patient did well in school and there were no indications of any learning disabilities this may be artifact of his increased caution related to attentional components.  Given the results of the current neuropsychological evaluation and the clinical history/presentation the patient does not appear to be a particularly good candidate for use of psychostimulant type medications.  The patient was tried on an SSRI medication previously but reports that he did not feel  like he responded particularly well.  However, this was also during a time when he was continuing to regularly consume alcohol and alcohol has the potential to reduce the effectiveness of SSRI medications.  I would strongly encourage the patient to work on significantly decreasing or completely stopping alcohol at least for an extended period of time to allow for potentially improved sleep architecture when he is sleeping and to have another trial of SSRI medication to see if there is a better response.  I suspect that anxiety/depressive symptomatology.  Regular alcohol use, suboptimal duration of sleep and significant impact of alcohol on sleep architecture are likely playing a primary role in the patient's attentional issues.  Diagnosis:    Current mild episode of major depressive disorder without prior episode [facility]  Social anxiety disorder  Alcohol use disorder, mild, abuse  Attention and concentration deficit   _____________________ Arley Phenix, Psy.D. Clinical Neuropsychologist

## 2022-10-29 ENCOUNTER — Ambulatory Visit: Payer: 59 | Admitting: Psychiatry

## 2022-12-04 ENCOUNTER — Ambulatory Visit: Payer: 59 | Admitting: Psychiatry

## 2022-12-30 ENCOUNTER — Encounter: Payer: Self-pay | Admitting: Psychiatry

## 2022-12-30 ENCOUNTER — Ambulatory Visit (INDEPENDENT_AMBULATORY_CARE_PROVIDER_SITE_OTHER): Payer: 59 | Admitting: Psychiatry

## 2022-12-30 VITALS — BP 115/82 | HR 74 | Temp 97.9°F | Ht 70.0 in | Wt 193.2 lb

## 2022-12-30 DIAGNOSIS — F1010 Alcohol abuse, uncomplicated: Secondary | ICD-10-CM | POA: Diagnosis not present

## 2022-12-30 DIAGNOSIS — F3342 Major depressive disorder, recurrent, in full remission: Secondary | ICD-10-CM | POA: Diagnosis not present

## 2022-12-30 DIAGNOSIS — F4010 Social phobia, unspecified: Secondary | ICD-10-CM

## 2022-12-30 DIAGNOSIS — R41840 Attention and concentration deficit: Secondary | ICD-10-CM

## 2022-12-30 DIAGNOSIS — G4709 Other insomnia: Secondary | ICD-10-CM | POA: Diagnosis not present

## 2022-12-30 NOTE — Progress Notes (Unsigned)
BH MD OP Progress Note  12/30/2022 5:22 PM Terry Summers  MRN:  [id number]  Chief Complaint:  Chief Complaint  Patient presents with   Follow-up   Anxiety   Depression   HPI: Terry Summers is a 37 year old Caucasian male, employed, married, lives in [place], has a history of MDD, social anxiety disorder, alcohol use disorder, vitamin D deficiency was evaluated in office today.  Patient today reports overall he is currently doing fairly well with regards to his mood symptoms.  Denies any significant depression.  Anxiety symptoms are manageable.  Patient reports he does have trazodone available as needed however has not been needing it.  He has been sleeping better.  Patient reports he is cutting back on his alcohol use.  Currently uses alcohol every 2 weeks 5-6 beers per day for just 1 day.  Patient reports he uses 12 ounce beers when he does that.  He is proud of himself since he has been able to cut back on alcohol use from 12 pack/day to 5-6 beers once every 2 weeks.  Patient denies any suicidality, homicidality or perceptual disturbances.  Patient denies any other concerns today.  Visit Diagnosis:    ICD-10-CM   1. MDD (major depressive disorder), recurrent, in full remission (HCC)  F33.42     2. Social anxiety disorder  F40.10     3. Other insomnia  G47.09     4. Alcohol use disorder, mild, abuse  F10.10     5. Attention and concentration deficit  R41.840       Past Psychiatric History: I have reviewed past psychiatric history from progress note on 05/08/2021.  Past trials of sertraline-sexual side effect, nausea, Lexapro-side effect.  Past Medical History:  Past Medical History:  Diagnosis Date   Anxiety    Depression     Past Surgical History:  Procedure Laterality Date   APPENDECTOMY  2011   LASIK Bilateral 2013   VASECTOMY  2019   WISDOM TOOTH EXTRACTION Bilateral 2006    Family Psychiatric History: I have reviewed family psychiatric history from progress note on  05/08/2021.  Family History:  Family History  Problem Relation Age of Onset   Stroke Paternal Grandfather    Stroke Paternal Grandmother    ADD / ADHD Son    Prostate cancer Neg Hx    Kidney cancer Neg Hx    Bladder Cancer Neg Hx     Social History: I have reviewed social history from progress note on 05/08/2021. Social History   Socioeconomic History   Marital status: Married    Spouse name: Terry Summers   Number of children: 2   Years of education: 14   Highest education level: Associate degree: academic program  Occupational History   Not on file  Tobacco Use   Smoking status: Never    Passive exposure: Never   Smokeless tobacco: Never  Vaping Use   Vaping status: Never Used  Substance and Sexual Activity   Alcohol use: Yes    Alcohol/week: 15.0 standard drinks of alcohol    Types: 15 Cans of beer per week   Drug use: Not Currently   Sexual activity: Yes    Partners: Female    Birth control/protection: Surgical  Other Topics Concern   Not on file  Social History Narrative   Not on file   Social Determinants of Health   Financial Resource Strain: Not on file  Food Insecurity: Not on file  Transportation Needs: Not on file  Physical Activity:  Not on file  Stress: Not on file  Social Connections: Not on file    Allergies:  Allergies  Allergen Reactions   Penicillins Other (See Comments)    Pt states rash/unknown "I was a baby when it happened".     Metabolic Disorder Labs: No results found for: "HGBA1C", "MPG" No results found for: "PROLACTIN" Lab Results  Component Value Date   CHOL 173 03/26/2022   TRIG 186 (H) 03/26/2022   HDL 53 03/26/2022   CHOLHDL 3.3 03/26/2022   LDLCALC 88 03/26/2022   LDLCALC 138 (H) 03/21/2021   Lab Results  Component Value Date   TSH 2.830 03/26/2022   TSH 3.200 03/21/2021    Therapeutic Level Labs: No results found for: "LITHIUM" No results found for: "VALPROATE" No results found for: "CBMZ"  Current  Medications: Current Outpatient Medications  Medication Sig Dispense Refill   traZODone (DESYREL) 100 MG tablet TAKE 1-1.5 TABLETS (100-150 MG TOTAL) BY MOUTH AT BEDTIME AS NEEDED FOR SLEEP. 135 tablet 0   No current facility-administered medications for this visit.     Musculoskeletal: Strength & Muscle Tone: within normal limits Gait & Station: normal Patient leans: N/A  Psychiatric Specialty Exam: Review of Systems  Psychiatric/Behavioral: Negative.      Blood pressure 115/82, pulse 74, temperature 97.9 F (36.6 C), temperature source Skin, height 5\' 10"  (1.778 m), weight 193 lb 3.2 oz (87.6 kg).Body mass index is 27.72 kg/m.  General Appearance: Fairly Groomed  Eye Contact:  Fair  Speech:  Clear and Coherent  Volume:  Normal  Mood:  Euthymic  Affect:  Congruent  Thought Process:  Goal Directed and Descriptions of Associations: Intact  Orientation:  Full (Time, Place, and Person)  Thought Content: Logical   Suicidal Thoughts:  No  Homicidal Thoughts:  No  Memory:  Immediate;   Fair Recent;   Fair Remote;   Fair  Judgement:  Fair  Insight:  Fair  Psychomotor Activity:  Normal  Concentration:  Concentration: Fair and Attention Span: Fair  Recall:  [employer] of Knowledge: Fair  Language: Fair  Akathisia:  No  Handed:  Right  AIMS (if indicated): not done  Assets:  Desire for Improvement Housing Intimacy Social Support Talents/Skills Transportation  ADL's:  Intact  Cognition: WNL  Sleep:  Fair   Screenings: Midwife Visit from 06/14/2021 in [facility] Office Visit from 05/08/2021 in [facility]  AIMS Total Score 0 0      AUDIT    Flowsheet Row Office Visit from 04/04/2022 in [facility]  Alcohol Use Disorder Identification Test Final Score (AUDIT) 14      GAD-7    Flowsheet Row Office Visit from 07/16/2022 in  [facility] Office Visit from 05/13/2022 in [facility] Office Visit from 04/04/2022 in [facility] Office Visit from 03/20/2022 in [facility] Office Visit from 03/19/2022 in [facility]  Total GAD-7 Score 3 4 4 6 10       PHQ2-9    Flowsheet Row Office Visit from 12/30/2022 in [facility] Office Visit from 07/16/2022 in [facility] Office Visit from 05/13/2022 in [facility] Office Visit from 04/04/2022 in [facility] Office Visit  from 03/20/2022 in [facility]  PHQ-2 Total Score 0 0 0 1 0  PHQ-9 Total Score -- 0 3 3 3       Flowsheet Row Office Visit from 07/16/2022 in [facility] Office Visit from 05/13/2022 in [facility] Office Visit from 04/04/2022 in [facility]  C-SSRS RISK CATEGORY No Risk No Risk No Risk        Assessment and Plan: Khiem Hunn is a 37 year old Caucasian male, employed, married, lives in [place], has a history of depression, anxiety, alcoholism was evaluated in office today.  Patient is currently improving.  Plan as noted below.  Plan MDD in remission Continue CBT as needed  Social anxiety disorder-improving Continue CBT with Ms. Denny Levy  Alcohol use disorder mild-in partial remission Continue CBT Patient noncompliant on gabapentin and declined naltrexone.  Insomnia-improving Trazodone 100-150 mg p.o. nightly as needed  Attention and concentration deficit-improving I have reviewed and discussed neuropsychological testing per Dr.  Lindon Romp 08/08/2022-patient does not meet criteria for ADHD. Provided brief supportive psychotherapy.    Collaboration of Care: Collaboration of Care: Referral or follow-up with counselor/therapist AEB patient to continue CBT  Patient/Guardian was advised Release of Information must be obtained prior to any record release in order to collaborate their care with an outside provider. Patient/Guardian was advised if they have not already done so to contact the registration department to sign all necessary forms in order for [place] to release information regarding their care.   Consent: Patient/Guardian gives verbal consent for treatment and assignment of benefits for services provided during this visit. Patient/Guardian expressed understanding and agreed to proceed.   Follow-up in clinic as needed.  I have spent atleast 30 minutes face to face with patient today which includes the time spent for preparing to see the patient ( e.g., review of test, records ), obtaining and to review and separately obtained history , ordering medications psychoeducation and supportive psychotherapy and care coordination,as well as documenting clinical information in electronic health record,interpreting and communication of neuropsychological testing report.   This note was generated in part or whole with voice recognition software. Voice recognition is usually quite accurate but there are transcription errors that can and very often do occur. I apologize for any typographical errors that were not detected and corrected.    Jomarie Longs, MD 12/30/2022, 5:22 PM

## 2023-03-24 ENCOUNTER — Encounter: Payer: Self-pay | Admitting: Family Medicine

## 2023-03-24 ENCOUNTER — Ambulatory Visit (INDEPENDENT_AMBULATORY_CARE_PROVIDER_SITE_OTHER): Payer: Managed Care, Other (non HMO) | Admitting: Family Medicine

## 2023-03-24 VITALS — BP 130/80 | HR 67 | Ht 70.0 in | Wt 195.8 lb

## 2023-03-24 DIAGNOSIS — R7309 Other abnormal glucose: Secondary | ICD-10-CM

## 2023-03-24 DIAGNOSIS — Z23 Encounter for immunization: Secondary | ICD-10-CM

## 2023-03-24 DIAGNOSIS — G4709 Other insomnia: Secondary | ICD-10-CM

## 2023-03-24 DIAGNOSIS — M5412 Radiculopathy, cervical region: Secondary | ICD-10-CM

## 2023-03-24 DIAGNOSIS — Z136 Encounter for screening for cardiovascular disorders: Secondary | ICD-10-CM | POA: Diagnosis not present

## 2023-03-24 DIAGNOSIS — N521 Erectile dysfunction due to diseases classified elsewhere: Secondary | ICD-10-CM

## 2023-03-24 DIAGNOSIS — Z0000 Encounter for general adult medical examination without abnormal findings: Secondary | ICD-10-CM

## 2023-03-24 DIAGNOSIS — F418 Other specified anxiety disorders: Secondary | ICD-10-CM

## 2023-03-24 MED ORDER — PREDNISONE 50 MG PO TABS: 50.0000 mg | ORAL_TABLET | Freq: Every day | ORAL | 0 refills | Status: DC

## 2023-03-24 MED ORDER — METHOCARBAMOL 750 MG PO TABS: 750.0000 mg | ORAL_TABLET | Freq: Three times a day (TID) | ORAL | 0 refills | Status: AC | PRN

## 2023-03-24 MED ORDER — SILDENAFIL CITRATE 100 MG PO TABS: 100.0000 mg | ORAL_TABLET | ORAL | 3 refills | Status: AC | PRN

## 2023-03-24 MED ORDER — GABAPENTIN 300 MG PO CAPS: 300.0000 mg | ORAL_CAPSULE | Freq: Every day | ORAL | 0 refills | Status: AC

## 2023-03-24 MED ORDER — MELOXICAM 15 MG PO TABS
15.0000 mg | ORAL_TABLET | Freq: Every day | ORAL | 0 refills | Status: DC
Start: 2023-03-24 — End: 2023-10-17

## 2023-03-24 NOTE — Patient Instructions (Addendum)
-   Obtain fasting labs with orders provided (can have water or black coffee but otherwise no food or drink x 8 hours before labs) - Review information provided - Attend eye doctor annually, dentist every 6 months, work towards or maintain 30 minutes of moderate intensity physical activity at least 5 days per week, and consume a balanced diet - Return in 1 year for physical - Contact us  for any questions between now and then   Your Plan:  1. Cervical Radiculopathy:    - Prednisone  for 5 days, then Meloxicam  daily with food.    - Gabapentin  nightly and Robaxin  as needed.    - Neck x-ray if symptoms persist by Friday of next week.    - Start neck exercises after 2 weeks if symptoms improve.  Feels.    - Use neck support and moist heat; avoid ibuprofen.    - Use Tylenol  for additional pain control if needed.  2. Anxiety:    - Continue seeing therapist and discuss management strategies.    - Consider physical activity for stress relief.  3. Erectile Dysfunction:    - Use Sildenafil  as needed.

## 2023-03-24 NOTE — Assessment & Plan Note (Signed)
 Erectile Dysfunction Performance anxiety related to sexual activity. -Prescribe Sildenafil  trial to dose as needed.

## 2023-03-24 NOTE — Assessment & Plan Note (Signed)
 Annual examination completed, risk stratification labs ordered, anticipatory guidance provided.  We will follow labs once resulted.

## 2023-03-24 NOTE — Assessment & Plan Note (Signed)
 He has been experiencing left arm pain and numbness for the past 15 days. The symptoms began with soreness in the middle of his back, progressing to shooting pains down his left arm. The pain is severe, rated 8-9 out of 10, especially during activities and subsides when the arm is at rest.  He experiences numbness and a pins-and-needles sensation in three fingers of his left hand, persisting for two weeks. There is decreased strength in the left arm, with pain radiating from the back down to the fingers. He recalls a possible trigger event while playing soccer, but symptoms did not appear immediately.  He has been taking the maximum dose of ibuprofen for the past week, which provides only temporary relief. He has also used a heating pad and ice, with ice offering short-term relief by numbing the area.  No similar symptoms on the right side and no previous episodes of these symptoms. He has not engaged in any significant changes in activity other than playing soccer towards the end of his season.  Cervical Radiculopathy Pain and numbness in the left arm for 15 days, with decreased strength and pins and needles sensation in the left hand. Likely left C5-C6 nerve root involvement. No improvement with ibuprofen, heat, or cold therapy. -Start Prednisone  for 5 days. -After prednisone  course, dose meloxicam  daily (take with food) -Start Gabapentin  nightly until symptoms in the hands and arms are completely resolved. -Start Robaxin  as needed for muscle tightness. -Obtain ordered neck x-ray if symptoms persist after 5 days of Prednisone  and meloxicam  by next Friday. -Start neck exercises after 2 weeks if symptoms improve. -Use neck support and moist heat for symptom relief. -Avoid ibuprofen while on these medications, use Tylenol  for additional pain control if needed.

## 2023-03-24 NOTE — Progress Notes (Signed)
 Annual Physical Exam Visit  Patient Information:  Patient ID: Terry Summers, male DOB: 02-16-1985 Age: 38 y.o. MRN: [id number]   Subjective:   CC: Annual Physical Exam  HPI:  Terry Summers is here for their annual physical.  I reviewed the past medical history, family history, social history, surgical history, and allergies today and changes were made as necessary.  Please see the problem list section below for additional details.  Past Medical History: Past Medical History:  Diagnosis Date   Anxiety    Depression    Past Surgical History: Past Surgical History:  Procedure Laterality Date   APPENDECTOMY  2011   LASIK Bilateral 2013   VASECTOMY  2019   WISDOM TOOTH EXTRACTION Bilateral 2006   Family History: Family History  Problem Relation Age of Onset   Stroke Paternal Grandfather    Stroke Paternal Grandmother    ADD / ADHD Son    Prostate cancer Neg Hx    Kidney cancer Neg Hx    Bladder Cancer Neg Hx    Allergies: Allergies  Allergen Reactions   Penicillins Other (See Comments)    Pt states rash/unknown "I was a baby when it happened".    Health Maintenance: Health Maintenance  Topic Date Due   INFLUENZA VACCINE  05/12/2023 (Originally 09/12/2022)   COVID-19 Vaccine (1) 01/05/2026 (Originally 10/16/1989)   DTaP/Tdap/Td (3 - Td or Tdap) 03/23/2033   Hepatitis C Screening  Completed   HIV Screening  Completed   HPV VACCINES  Aged Out    HM Colonoscopy   This patient has no relevant Health Maintenance data.    Medications: No current outpatient medications on file prior to visit.   No current facility-administered medications on file prior to visit.    Objective:   Vitals:   03/24/23 1543  BP: 130/80  Pulse: 67  SpO2: 90%   Vitals:   03/24/23 1543  Weight: 195 lb 12.8 oz (88.8 kg)  Height: 5\' 10"  (1.778 m)   Body mass index is 28.09 kg/m.  General: Well Developed, well nourished, and in no acute distress.  Neuro: Alert and oriented x3,  extra-ocular muscles intact, sensation grossly intact. Cranial nerves II through XII are grossly intact, motor, sensory, and coordinative functions are intact. HEENT: Normocephalic, atraumatic, neck supple, no masses, no lymphadenopathy, thyroid nonenlarged. Oropharynx, nasopharynx, external ear canals are unremarkable. Skin: Warm and dry, no rashes noted.  Cardiac: Regular rate and rhythm, no murmurs rubs or gallops. No peripheral edema. Pulses symmetric. Respiratory: Clear to auscultation bilaterally. Speaking in full sentences.  Abdominal: Soft, nontender, nondistended, positive bowel sounds, no masses, no organomegaly. Musculoskeletal: Left levator scapula tender upon palpation. Tenderness along the medial scapula more on the left. Positive Spurling's test bilaterally.  2+ upper extremity reflexes.   Impression and Recommendations:   The patient was counselled, risk factors were discussed, and anticipatory guidance given.  Problem List Items Addressed This Visit     Cervical radiculopathy   He has been experiencing left arm pain and numbness for the past 15 days. The symptoms began with soreness in the middle of his back, progressing to shooting pains down his left arm. The pain is severe, rated 8-9 out of 10, especially during activities and subsides when the arm is at rest.  He experiences numbness and a pins-and-needles sensation in three fingers of his left hand, persisting for two weeks. There is decreased strength in the left arm, with pain radiating from the back down to the fingers.  He recalls a possible trigger event while playing soccer, but symptoms did not appear immediately.  He has been taking the maximum dose of ibuprofen for the past week, which provides only temporary relief. He has also used a heating pad and ice, with ice offering short-term relief by numbing the area.  No similar symptoms on the right side and no previous episodes of these symptoms. He has not engaged in  any significant changes in activity other than playing soccer towards the end of his season.  Cervical Radiculopathy Pain and numbness in the left arm for 15 days, with decreased strength and pins and needles sensation in the left hand. Likely left C5-C6 nerve root involvement. No improvement with ibuprofen, heat, or cold therapy. -Start Prednisone  for 5 days. -After prednisone  course, dose meloxicam  daily (take with food) -Start Gabapentin  nightly until symptoms in the hands and arms are completely resolved. -Start Robaxin  as needed for muscle tightness. -Obtain ordered neck x-ray if symptoms persist after 5 days of Prednisone  and meloxicam  by next Friday. -Start neck exercises after 2 weeks if symptoms improve. -Use neck support and moist heat for symptom relief. -Avoid ibuprofen while on these medications, use Tylenol  for additional pain control if needed.      Relevant Medications   methocarbamol  (ROBAXIN ) 750 MG tablet   gabapentin  (NEURONTIN ) 300 MG capsule   Other Relevant Orders   DG Cervical Spine Complete   Erectile dysfunction due to diseases classified elsewhere   Erectile Dysfunction Performance anxiety related to sexual activity. -Prescribe Sildenafil  trial to dose as needed.      Healthcare maintenance - Primary   Annual examination completed, risk stratification labs ordered, anticipatory guidance provided.  We will follow labs once resulted.      Relevant Orders   CBC   Tdap vaccine greater than or equal to 7yo IM (Completed)   Other insomnia   Relevant Orders   TSH   Other specified anxiety disorders   Anxiety Ongoing issues with anxiety, particularly related to performance. Currently seeing a therapist with benefit. -Continue therapy. -Consider physical activity as a method of stress relief. -Discuss anxiety management strategies with the therapist.      Other Visit Diagnoses       Screening for cardiovascular condition       Relevant Orders    Comprehensive metabolic panel   Lipid panel     Abnormal glucose       Relevant Orders   Hemoglobin A1c        Orders & Medications Medications:  Meds ordered this encounter  Medications   predniSONE  (DELTASONE ) 50 MG tablet    Sig: Take 1 tablet (50 mg total) by mouth daily.    Dispense:  5 tablet    Refill:  0   methocarbamol  (ROBAXIN ) 750 MG tablet    Sig: Take 1 tablet (750 mg total) by mouth every 8 (eight) hours as needed for muscle spasms.    Dispense:  60 tablet    Refill:  0   gabapentin  (NEURONTIN ) 300 MG capsule    Sig: Take 1 capsule (300 mg total) by mouth at bedtime.    Dispense:  30 capsule    Refill:  0   meloxicam  (MOBIC ) 15 MG tablet    Sig: Take 1 tablet (15 mg total) by mouth daily.    Dispense:  30 tablet    Refill:  0   sildenafil  (VIAGRA ) 100 MG tablet    Sig: Take 1 tablet (100 mg total)  by mouth as needed for erectile dysfunction (for use prior to sexual activity).    Dispense:  10 tablet    Refill:  3   Orders Placed This Encounter  Procedures   DG Cervical Spine Complete   Tdap vaccine greater than or equal to 7yo IM   CBC   Comprehensive metabolic panel   Hemoglobin A1c   Lipid panel   TSH     Return in about 1 year (around 03/23/2024) for CPE.    Ma Saupe, MD, [facility]   Primary Care Sports Medicine Primary Care and Sports Medicine at [facility]

## 2023-03-24 NOTE — Assessment & Plan Note (Signed)
 Anxiety Ongoing issues with anxiety, particularly related to performance. Currently seeing a therapist with benefit. -Continue therapy. -Consider physical activity as a method of stress relief. -Discuss anxiety management strategies with the therapist.

## 2023-06-17 LAB — COMPREHENSIVE METABOLIC PANEL WITH GFR
ALT: 17 IU/L (ref 0–44)
AST: 20 IU/L (ref 0–40)
Albumin: 4.5 g/dL (ref 4.1–5.1)
Alkaline Phosphatase: 65 IU/L (ref 44–121)
BUN/Creatinine Ratio: 12 (ref 9–20)
BUN: 15 mg/dL (ref 6–20)
Bilirubin Total: 0.3 mg/dL (ref 0.0–1.2)
CO2: 22 mmol/L (ref 20–29)
Calcium: 9.3 mg/dL (ref 8.7–10.2)
Chloride: 102 mmol/L (ref 96–106)
Creatinine, Ser: 1.26 mg/dL (ref 0.76–1.27)
Globulin, Total: 2 g/dL (ref 1.5–4.5)
Glucose: 102 mg/dL — ABNORMAL HIGH (ref 70–99)
Potassium: 4.9 mmol/L (ref 3.5–5.2)
Sodium: 139 mmol/L (ref 134–144)
Total Protein: 6.5 g/dL (ref 6.0–8.5)
eGFR: 75 mL/min/{1.73_m2} (ref >59)

## 2023-06-17 LAB — CBC
Hematocrit: 41.2 % (ref 37.5–51.0)
Hemoglobin: 13.4 g/dL (ref 13.0–17.7)
MCH: 27.5 pg (ref 26.6–33.0)
MCHC: 32.5 g/dL (ref 31.5–35.7)
MCV: 84 fL (ref 79–97)
Platelets: 253 10*3/uL (ref 150–450)
RBC: 4.88 x10E6/uL (ref 4.14–5.80)
RDW: 13.5 % (ref 11.6–15.4)
WBC: 4.9 10*3/uL (ref 3.4–10.8)

## 2023-06-17 LAB — HEMOGLOBIN A1C
Est. average glucose Bld gHb Est-mCnc: 120 mg/dL
Hgb A1c MFr Bld: 5.8 % — ABNORMAL HIGH (ref 4.8–5.6)

## 2023-06-17 LAB — LIPID PANEL
Chol/HDL Ratio: 2.7 ratio (ref 0.0–5.0)
Cholesterol, Total: 163 mg/dL (ref 100–199)
HDL: 61 mg/dL (ref >39)
LDL Chol Calc (NIH): 83 mg/dL (ref 0–99)
Triglycerides: 105 mg/dL (ref 0–149)
VLDL Cholesterol Cal: 19 mg/dL (ref 5–40)

## 2023-06-17 LAB — TSH: TSH: 3.13 u[IU]/mL (ref 0.450–4.500)

## 2023-08-04 ENCOUNTER — Ambulatory Visit: Payer: Self-pay | Admitting: Family Medicine

## 2023-10-17 ENCOUNTER — Ambulatory Visit (INDEPENDENT_AMBULATORY_CARE_PROVIDER_SITE_OTHER): Admitting: Family Medicine

## 2023-10-17 ENCOUNTER — Ambulatory Visit
Admission: RE | Admit: 2023-10-17 | Discharge: 2023-10-17 | Disposition: A | Discharge status: Home / Self Care | Attending: Family Medicine | Admitting: Family Medicine

## 2023-10-17 ENCOUNTER — Ambulatory Visit
Admission: RE | Admit: 2023-10-17 | Discharge: 2023-10-17 | Disposition: A | Discharge status: Home / Self Care | Source: Ambulatory Visit | Attending: Family Medicine | Admitting: Family Medicine

## 2023-10-17 ENCOUNTER — Encounter: Payer: Self-pay | Admitting: Family Medicine

## 2023-10-17 VITALS — BP 116/78 | HR 55 | Ht 70.0 in | Wt 172.8 lb

## 2023-10-17 DIAGNOSIS — M2391 Unspecified internal derangement of right knee: Secondary | ICD-10-CM

## 2023-10-17 MED ORDER — MELOXICAM 15 MG PO TABS: 15.0000 mg | ORAL_TABLET | Freq: Every day | ORAL | 0 refills | Status: AC

## 2023-10-17 NOTE — Assessment & Plan Note (Signed)
 History of Present Illness Terry Summers is a 38 year old male who presents with persistent right knee pain following a soccer injury.  Right knee pain and swelling - Persistent right knee pain for approximately two months following a soccer injury - Pain localized to the inner aspect of the right knee - Pain exacerbated by twisting motions and bending the knee backward - Unable to sit cross-legged without pain - Difficulty with pivoting and cutting motions during soccer practice - Running straight on flat ground is tolerable, but uphill running and turning the foot to pass the ball cause a 'shock' of pain - Pain described as similar to a 'bone bruise' - No pain radiation - Initially had visible swelling; currently, the area feels fuller compared to baseline - No bruising present  Knee instability - Occasional 'buckling' of the right knee, occurring once or twice daily - No falls resulting from buckling episodes  Functional limitations - Difficulty walking due to pain and swelling - Difficulty with activities requiring knee twisting, pivoting, or cutting - Running straight is tolerable, but uphill running and certain soccer maneuvers provoke pain  Symptom management and response to treatment - Uses ibuprofen 400 mg once daily as needed, when remembered - Applies ice and uses a compression brace after jogging - Ibuprofen, ice, and compression brace help reduce swelling and pain if used after activity  Secondary musculoskeletal pain - Recent onset of lower calf pain in the past few days, attributed to altered gait from knee pain  Physical Exam INSPECTION: Mild medial knee fullness compared to contralateral. No ecchymosis. PALPATION: Negative for effusion. Minor tenderness at the medial patellar facet. Maximal tenderness at the anteromedial joint line of right knee. Non-tender at lateral patellar facet, medial femoral condyle, and medial tibial plateau. RANGE OF MOTION: Range of motion  from 0 to 125 degrees limited by terminal flexion, no mechanical obstruction. SPECIAL TESTS: Negative anterior posterior drawer sign. Positive medial McMurray test. Negative varus and valgus stressing. No laxity or discomfort.  Assessment and Plan Right knee pain with suspected medial meniscus injury and mechanical symptoms Chronic right knee pain with suspected medial meniscus injury. Positive McMurray test suggests meniscus involvement. MRI required for further evaluation. Discussed non-surgical management potential, surgical consultation if symptoms persist or worsen. - Order MRI of the right knee to evaluate for meniscus injury. - Obtain x-ray of the right knee to rule out fractures and assist in MRI interpretation. - Prescribe meloxicam  once daily with food for inflammation control. - Advise use of a hinged knee brace during wakeful hours to prevent twisting motions. - Instruct to avoid soccer and other activities involving twisting or pivoting until MRI results are available. - Encourage straight-line activities like walking on flat surfaces if needed for mental health. - Recommend ice application for 20 minutes as needed for swelling and pain. - Allow use of Tylenol  for additional pain control if necessary. - Advise against the use of other NSAIDs while on meloxicam . - Provide home exercises once symptoms improve. - Schedule follow-up after MRI results are available.

## 2023-10-17 NOTE — Patient Instructions (Addendum)
 Right knee pain with suspected medial meniscus injury  - MRI of the right knee will be ordered to check for meniscus injury. - X-ray of the right knee will be ordered to rule out fractures and help with MRI interpretation. - Take meloxicam  once daily with food for inflammation. - Use a hinged knee brace during the day to prevent twisting. - Avoid soccer and any activities that involve twisting or pivoting until MRI results are available. - Walking on flat surfaces is allowed. - Apply ice to the knee for 20 minutes as needed for swelling or pain. - You may use Tylenol  for extra pain relief if needed. - Do not use other NSAIDs while taking meloxicam . - Home exercises will be provided once symptoms improve. - Schedule a follow-up visit after MRI results are available.  Red flags: If you experience severe pain, sudden swelling, redness, warmth, fever, numbness, tingling, or sudden inability to move your knee, contact the office promptly.

## 2023-10-17 NOTE — Progress Notes (Signed)
 Primary Care / Sports Medicine Office Visit  Patient Information:  Patient ID: Terry Summers, male DOB: 1985-03-13 Age: 38 y.o. MRN: [id number]   Terry Summers is a pleasant 38 y.o. male presenting with the following:  Chief Complaint  Patient presents with   Knee Pain    Right medial knee pain for a couple months. He injured his knee playing soccer. Since his injury he's having pain when twisting  and ROM is not where it was before injury. He has tried to rest, ice, brace, and IBU. The brace helps with swelling as well. No xray's or PT for knee.    Vitals:   10/17/23 0948  BP: 116/78  Pulse: (!) 55  SpO2: 99%   Vitals:   10/17/23 0948  Weight: 172 lb 12.8 oz (78.4 kg)  Height: 5' 10 (1.778 m)   Body mass index is 24.79 kg/m.  No results found.   Independent interpretation of notes and tests performed by another provider:   None  Procedures performed:   None  Pertinent History, Exam, Impression, and Recommendations:   Problem List Items Addressed This Visit     Internal derangement of knee, acute, right - Primary   History of Present Illness Terry Summers is a 38 year old male who presents with persistent right knee pain following a soccer injury.  Right knee pain and swelling - Persistent right knee pain for approximately two months following a soccer injury - Pain localized to the inner aspect of the right knee - Pain exacerbated by twisting motions and bending the knee backward - Unable to sit cross-legged without pain - Difficulty with pivoting and cutting motions during soccer practice - Running straight on flat ground is tolerable, but uphill running and turning the foot to pass the ball cause a 'shock' of pain - Pain described as similar to a 'bone bruise' - No pain radiation - Initially had visible swelling; currently, the area feels fuller compared to baseline - No bruising present  Knee instability - Occasional 'buckling' of the right knee, occurring  once or twice daily - No falls resulting from buckling episodes  Functional limitations - Difficulty walking due to pain and swelling - Difficulty with activities requiring knee twisting, pivoting, or cutting - Running straight is tolerable, but uphill running and certain soccer maneuvers provoke pain  Symptom management and response to treatment - Uses ibuprofen 400 mg once daily as needed, when remembered - Applies ice and uses a compression brace after jogging - Ibuprofen, ice, and compression brace help reduce swelling and pain if used after activity  Secondary musculoskeletal pain - Recent onset of lower calf pain in the past few days, attributed to altered gait from knee pain  Physical Exam INSPECTION: Mild medial knee fullness compared to contralateral. No ecchymosis. PALPATION: Negative for effusion. Minor tenderness at the medial patellar facet. Maximal tenderness at the anteromedial joint line of right knee. Non-tender at lateral patellar facet, medial femoral condyle, and medial tibial plateau. RANGE OF MOTION: Range of motion from 0 to 125 degrees limited by terminal flexion, no mechanical obstruction. SPECIAL TESTS: Negative anterior posterior drawer sign. Positive medial McMurray test. Negative varus and valgus stressing. No laxity or discomfort.  Assessment and Plan Right knee pain with suspected medial meniscus injury and mechanical symptoms Chronic right knee pain with suspected medial meniscus injury. Positive McMurray test suggests meniscus involvement. MRI required for further evaluation. Discussed non-surgical management potential, surgical consultation if symptoms persist or worsen. - Order  MRI of the right knee to evaluate for meniscus injury. - Obtain x-ray of the right knee to rule out fractures and assist in MRI interpretation. - Prescribe meloxicam  once daily with food for inflammation control. - Advise use of a hinged knee brace during wakeful hours to prevent  twisting motions. - Instruct to avoid soccer and other activities involving twisting or pivoting until MRI results are available. - Encourage straight-line activities like walking on flat surfaces if needed for mental health. - Recommend ice application for 20 minutes as needed for swelling and pain. - Allow use of Tylenol  for additional pain control if necessary. - Advise against the use of other NSAIDs while on meloxicam . - Provide home exercises once symptoms improve. - Schedule follow-up after MRI results are available.      Relevant Medications   meloxicam  (MOBIC ) 15 MG tablet   Other Relevant Orders   DG Knee Complete 4 Views Right   MR Knee Right Wo Contrast     Orders & Medications Medications:  Meds ordered this encounter  Medications   meloxicam  (MOBIC ) 15 MG tablet    Sig: Take 1 tablet (15 mg total) by mouth daily. Take with food.    Dispense:  30 tablet    Refill:  0   Orders Placed This Encounter  Procedures   DG Knee Complete 4 Views Right   MR Knee Right Wo Contrast     No follow-ups on file.     Selinda JINNY Ku, MD, [facility]   Primary Care Sports Medicine Primary Care and Sports Medicine at [facility]

## 2023-10-23 ENCOUNTER — Other Ambulatory Visit: Payer: Self-pay | Admitting: Family Medicine

## 2023-10-23 DIAGNOSIS — M2391 Unspecified internal derangement of right knee: Secondary | ICD-10-CM

## 2023-10-25 ENCOUNTER — Inpatient Hospital Stay: Admission: RE | Admit: 2023-10-25 | Source: Ambulatory Visit

## 2023-10-28 ENCOUNTER — Inpatient Hospital Stay
Admission: RE | Admit: 2023-10-28 | Discharge: 2023-10-28 | Discharge status: Home / Self Care | Source: Ambulatory Visit | Attending: Family Medicine

## 2023-10-28 DIAGNOSIS — M2391 Unspecified internal derangement of right knee: Secondary | ICD-10-CM

## 2023-10-29 ENCOUNTER — Ambulatory Visit: Payer: Self-pay | Admitting: Family Medicine

## 2023-11-04 ENCOUNTER — Other Ambulatory Visit

## 2023-11-04 ENCOUNTER — Encounter: Payer: Self-pay | Admitting: Family Medicine

## 2023-11-04 ENCOUNTER — Ambulatory Visit (INDEPENDENT_AMBULATORY_CARE_PROVIDER_SITE_OTHER): Admitting: Family Medicine

## 2023-11-04 ENCOUNTER — Other Ambulatory Visit: Payer: Self-pay | Admitting: Radiology

## 2023-11-04 VITALS — BP 98/60 | HR 62 | Ht 70.0 in | Wt 176.0 lb

## 2023-11-04 DIAGNOSIS — M2391 Unspecified internal derangement of right knee: Secondary | ICD-10-CM

## 2023-11-04 DIAGNOSIS — S838X1A Sprain of other specified parts of right knee, initial encounter: Secondary | ICD-10-CM

## 2023-11-04 MED ORDER — TRIAMCINOLONE ACETONIDE 40 MG/ML IJ SUSP
40.0000 mg | Freq: Once | INTRAMUSCULAR | Status: AC
  Administered 2023-11-04: 40 mg via INTRAMUSCULAR

## 2023-11-04 MED ORDER — DICLOFENAC SODIUM 75 MG PO TBEC: 75.0000 mg | DELAYED_RELEASE_TABLET | Freq: Two times a day (BID) | ORAL | 0 refills | Status: AC | PRN

## 2023-11-04 NOTE — Progress Notes (Signed)
 Primary Care / Sports Medicine Office Visit  Patient Information:  Patient ID: Terry Summers, male DOB: 1985/09/06 Age: 38 y.o. MRN: [id number]   Terry Summers is a pleasant 38 y.o. male presenting with the following:  Chief Complaint  Patient presents with   Knee Pain    Patient presents today for a follow up on his MRI results of his right knee.     Vitals:   11/04/23 1500  BP: 98/60  Pulse: 62  SpO2: 97%   Vitals:   11/04/23 1500  Weight: 176 lb (79.8 kg)  Height: 5' 10 (1.778 m)   Body mass index is 25.25 kg/m.  MR Knee Right Wo Contrast Result Date: 10/28/2023 MR KNEE WITHOUT IV CONTRAST RIGHT COMPARISON: None. CLINICAL HISTORY: Knee trauma. Knee pain. PULSE SEQUENCES: Ax PD FS, Sag T2 ACL, Sag PD FS, Cor PD FS & COR T1 FINDINGS: Bones: There is no fracture or contusion pattern. No full-thickness cartilage defect is identified. The extensor mechanism is intact. Trace joint effusion. Ligaments: The ACL, PCL, MCL and fibular collateral ligament are intact. Menisci: Lateral meniscus is unremarkable. There is a small radial tear along the free edge of the posterior horn of the medial meniscus. No significant meniscal tear is present. IMPRESSION: Small radial tear along the free edge of the posterior horn the medial meniscus best seen on coronal image 9. No significant meniscal tear is present. Otherwise, unremarkable MRI of the right knee. Electronically signed by: Norleen Satchel MD 10/28/2023 05:10 PM EDT RP Workstation: [id number]   DG Knee Complete 4 Views Right Result Date: 10/25/2023 CLINICAL DATA:  medial acute pain, eval degenerative changes and acute osseous processes EXAM: RIGHT KNEE - COMPLETE 4+ VIEW COMPARISON:  None Available. FINDINGS: No evidence of fracture, dislocation, or joint effusion. No evidence of arthropathy or other focal bone abnormality. Soft tissues are unremarkable. IMPRESSION: Negative. Electronically Signed   By: Morgane  Naveau M.D.   On: 10/25/2023 22:07      Discussed the use of AI scribe software for clinical note transcription with the patient, who gave verbal consent to proceed.   Independent interpretation of notes and tests performed by another provider:   Right knee MRI: Small radial tear along the free edge of the posterior horn the medial meniscus   Procedures performed:   Procedure:  Injection of right knee under ultrasound guidance. Ultrasound guidance utilized for anteromedial Adapin approach, joint space visualized Samsung HS60 device utilized with permanent recording / reporting. Verbal informed consent obtained and verified. Skin prepped in a sterile fashion. Ethyl chloride for topical local analgesia.  Completed without difficulty and tolerated well. Medication: triamcinolone  acetonide 40 mg/mL suspension for injection 1 mL total and 2 mL lidocaine 1% without epinephrine utilized for needle placement anesthetic Advised to contact for fevers/chills, erythema, induration, drainage, or persistent bleeding.   Pertinent History, Exam, Impression, and Recommendations:   Problem List Items Addressed This Visit     Internal derangement of knee, acute, right - Primary   History of Present Illness Terry Summers is a 38 year old male with a right knee meniscus tear who presents with persistent knee pain and buckling.  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 Favorable for non-surgical management. - Administer cortisone injection to right knee under ultrasound guidance. Discussed risks and benefits, including symptom relief and facilitation of physical therapy. PRP considered experimental and discussed associated insurance coverage. - Prescribe diclofenac  twice daily as needed for pain. - Refer to physical therapy for knee biomechanics and stabilization. - Advise wearing knee brace outside of work. - Instruct to use ice on knee for 20 minutes as needed. - Contact us  in 6 weeks to assess progress, with earlier contact if no improvement.      Relevant Medications   diclofenac  (VOLTAREN ) 75 MG EC tablet     Orders & Medications Medications:  Meds ordered this encounter  Medications   triamcinolone  acetonide (KENALOG -40) injection 40 mg   diclofenac  (VOLTAREN ) 75 MG EC tablet    Sig: Take 1 tablet (75 mg total) by mouth 2 (two) times daily as needed.    Dispense:  60 tablet    Refill:  0   Orders Placed This Encounter  Procedures   US  LIMITED JOINT SPACE STRUCTURES LOW RIGHT     No follow-ups on file.     Selinda JINNY Ku, MD, [facility]   Primary Care Sports Medicine Primary Care and Sports Medicine at [facility]

## 2023-11-04 NOTE — Assessment & Plan Note (Addendum)
 History of Present Illness Terry Summers is a 38 year old male with a right knee meniscus tear who presents with persistent knee pain and buckling.  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 Favorable for non-surgical management. - Administer cortisone injection to right knee under ultrasound guidance. Discussed risks and benefits, including symptom relief and facilitation of physical therapy. PRP considered experimental and discussed associated insurance coverage. - Prescribe diclofenac  twice daily as needed for pain. - Refer to physical therapy for knee biomechanics and stabilization. - Advise wearing knee brace outside of work. - Instruct to use ice on knee for 20 minutes as needed. - Contact us  in 6 weeks to assess progress, with earlier contact if no improvement.

## 2023-11-04 NOTE — Patient Instructions (Signed)
 VISIT SUMMARY:  Today, we discussed your persistent right knee pain and buckling due to a meniscus tear. We reviewed your MRI results and decided on a non-surgical management plan to help alleviate your symptoms and improve your knee function.  YOUR PLAN:  CHRONIC RIGHT KNEE MEDIAL MENISCUS TEAR WITH ASSOCIATED PAIN: You have a small tear in the medial meniscus of your right knee, which is causing pain and occasional buckling. -You received a cortisone injection in your right knee to help relieve pain and facilitate physical therapy. We discussed the risks and benefits of this treatment. -You are prescribed diclofenac  to take twice daily as needed for pain. -You are referred to physical therapy to work on knee mechanics and stabilization. -Continue wearing your knee brace outside of work. -Use ice on your knee for 20 minutes as needed to help with pain and swelling. -Touch base in 6 weeks to relay your progress. Please contact us  earlier if you do not see any improvement.

## 2024-02-24 ENCOUNTER — Encounter: Payer: Self-pay | Admitting: Family Medicine

## 2024-02-24 NOTE — Telephone Encounter (Signed)
 Please review and advise patient.   JM

## 2024-02-26 ENCOUNTER — Other Ambulatory Visit: Payer: Self-pay | Admitting: Family Medicine

## 2024-02-26 DIAGNOSIS — N521 Erectile dysfunction due to diseases classified elsewhere: Secondary | ICD-10-CM

## 2024-02-26 DIAGNOSIS — Z0000 Encounter for general adult medical examination without abnormal findings: Secondary | ICD-10-CM

## 2024-03-04 ENCOUNTER — Ambulatory Visit: Payer: Self-pay | Admitting: Family Medicine

## 2024-03-04 LAB — COMPREHENSIVE METABOLIC PANEL WITH GFR
ALT: 21 IU/L (ref 0–44)
AST: 27 IU/L (ref 0–40)
Albumin: 4.6 g/dL (ref 4.1–5.1)
Alkaline Phosphatase: 63 IU/L (ref 47–123)
BUN/Creatinine Ratio: 17 (ref 9–20)
BUN: 19 mg/dL (ref 6–20)
Bilirubin Total: 0.4 mg/dL (ref 0.0–1.2)
CO2: 26 mmol/L (ref 20–29)
Calcium: 9.4 mg/dL (ref 8.7–10.2)
Chloride: 102 mmol/L (ref 96–106)
Creatinine, Ser: 1.12 mg/dL (ref 0.76–1.27)
Globulin, Total: 2.3 g/dL (ref 1.5–4.5)
Glucose: 115 mg/dL — ABNORMAL HIGH (ref 70–99)
Potassium: 5.1 mmol/L (ref 3.5–5.2)
Sodium: 141 mmol/L (ref 134–144)
Total Protein: 6.9 g/dL (ref 6.0–8.5)
eGFR: 86 mL/min/1.73

## 2024-03-04 LAB — CBC
Hematocrit: 42.5 % (ref 37.5–51.0)
Hemoglobin: 13.4 g/dL (ref 13.0–17.7)
MCH: 27.9 pg (ref 26.6–33.0)
MCHC: 31.5 g/dL (ref 31.5–35.7)
MCV: 89 fL (ref 79–97)
Platelets: 285 x10E3/uL (ref 150–450)
RBC: 4.8 x10E6/uL (ref 4.14–5.80)
RDW: 13.2 % (ref 11.6–15.4)
WBC: 4.2 x10E3/uL (ref 3.4–10.8)

## 2024-03-04 LAB — TESTOSTERONE,FREE AND TOTAL
Testosterone, Free: 19.6 pg/mL (ref 8.7–25.1)
Testosterone: 850 ng/dL (ref 264–916)

## 2024-03-04 LAB — LIPID PANEL
Chol/HDL Ratio: 2.8 ratio (ref 0.0–5.0)
Cholesterol, Total: 192 mg/dL (ref 100–199)
HDL: 69 mg/dL
LDL Chol Calc (NIH): 106 mg/dL — ABNORMAL HIGH (ref 0–99)
Triglycerides: 95 mg/dL (ref 0–149)
VLDL Cholesterol Cal: 17 mg/dL (ref 5–40)

## 2024-03-04 LAB — HEMOGLOBIN A1C
Est. average glucose Bld gHb Est-mCnc: 114 mg/dL
Hgb A1c MFr Bld: 5.6 % (ref 4.8–5.6)

## 2024-03-26 ENCOUNTER — Encounter: Payer: Managed Care, Other (non HMO) | Admitting: Family Medicine
